# Patient Record
Sex: Female | Born: 1990 | Race: White | Hispanic: No | Marital: Single | Smoking: Never smoker
Health system: Southern US, Community
[De-identification: ages and names within clinical notes are randomized; demographics above are authoritative.]

## PROBLEM LIST (undated history)

## (undated) DIAGNOSIS — F988 Other specified behavioral and emotional disorders with onset usually occurring in childhood and adolescence: Secondary | ICD-10-CM

## (undated) HISTORY — PX: WISDOM TOOTH EXTRACTION: SHX21

## (undated) HISTORY — PX: NO PAST SURGERIES: SHX2092

## (undated) HISTORY — DX: Other specified behavioral and emotional disorders with onset usually occurring in childhood and adolescence: F98.8

---
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
## 2016-01-16 ENCOUNTER — Encounter: Payer: Self-pay | Admitting: Family Medicine

## 2016-01-16 ENCOUNTER — Encounter: Payer: Self-pay | Admitting: *Deleted

## 2016-01-16 ENCOUNTER — Ambulatory Visit (INDEPENDENT_AMBULATORY_CARE_PROVIDER_SITE_OTHER): Payer: BLUE CROSS/BLUE SHIELD | Admitting: Family Medicine

## 2016-01-16 VITALS — BP 115/62 | HR 56 | Temp 98.4°F | Ht 68.0 in | Wt 174.5 lb

## 2016-01-16 DIAGNOSIS — F909 Attention-deficit hyperactivity disorder, unspecified type: Secondary | ICD-10-CM

## 2016-01-16 DIAGNOSIS — F325 Major depressive disorder, single episode, in full remission: Secondary | ICD-10-CM | POA: Insufficient documentation

## 2016-01-16 DIAGNOSIS — F988 Other specified behavioral and emotional disorders with onset usually occurring in childhood and adolescence: Secondary | ICD-10-CM

## 2016-01-16 DIAGNOSIS — G43001 Migraine without aura, not intractable, with status migrainosus: Secondary | ICD-10-CM | POA: Diagnosis not present

## 2016-01-16 NOTE — Progress Notes (Signed)
Pre visit review using our clinic review tool, if applicable. No additional management support is needed unless otherwise documented below in the visit note. 

## 2016-01-16 NOTE — Patient Instructions (Addendum)
Stop at lab on way out for UDS and controlled substance screen.  We will call when prescription ready for pick up.

## 2016-01-16 NOTE — Assessment & Plan Note (Signed)
Stable control on Adderall BID. Will obtain records from previous MD. UDS and controlled sub agreement.  Plan Q3 month follow up.

## 2016-01-16 NOTE — Assessment & Plan Note (Addendum)
Improved control , rare acute case. Treats with OTC meds.

## 2016-01-16 NOTE — Progress Notes (Signed)
   Subjective:    Patient ID: Lisa Ellison, female    DOB: 1991-07-26, 25 y.o.   MRN: [id number]  HPI  25 year old female presents to re-establish care.  She is treated by Dr. Ronita Hipps  in [address] in meantime. She has now moved back to [place].  She has not had a CPX in last few years. She has not seen GYN in years... She plans to re-establish.  Migraine, common: She is only having rare migraine, 2 times a year. Has improved since she has gotten older. Using Excedrin to treat and sleep, water. Last only one day.   ADD, well controlled on  Adderall 20 mg 8 AM, second tab as needed in afternoon for school work.  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 She needs [place] to take over her prescription, last prescription 12/9.   Anxiety and depression, situational during civil lawsuit. About 1 year ago. Did not take med, management with counselor.  Exercsie: Gradually increasing. Diet: Fruits and veggies, water      Review of Systems  Constitutional: Negative for fatigue.  HENT: Negative for ear pain.   Respiratory: Negative for cough and shortness of breath.   Cardiovascular: Negative for chest pain and leg swelling.  Gastrointestinal: Negative for abdominal pain.  Genitourinary: Negative for frequency.  Musculoskeletal: Negative for myalgias.  Neurological: Negative for dizziness.  Psychiatric/Behavioral: Negative for dysphoric mood.       Objective:   Physical Exam  Constitutional: Vital signs are normal. She appears well-developed and well-nourished. She is cooperative.  Non-toxic appearance. She does not appear ill. No distress.  HENT:  Head: Normocephalic.  Right Ear: Hearing, tympanic membrane, external ear and ear canal normal.  Left Ear: Hearing, tympanic membrane, external ear and ear canal normal.  Nose: Nose normal.  Eyes: Conjunctivae, EOM and lids are normal. Pupils are equal, round, and reactive to light. Lids are everted and swept, no foreign  bodies found.  Neck: Trachea normal and normal range of motion. Neck supple. Carotid bruit is not present. No thyroid mass and no thyromegaly present.  Cardiovascular: Normal rate, regular rhythm, S1 normal, S2 normal, normal heart sounds and intact distal pulses.  Exam reveals no gallop.   No murmur heard. Pulmonary/Chest: Effort normal and breath sounds normal. No respiratory distress. She has no wheezes. She has no rhonchi. She has no rales.  Abdominal: Soft. Normal appearance and bowel sounds are normal. She exhibits no distension, no fluid wave, no abdominal bruit and no mass. There is no hepatosplenomegaly. There is no tenderness. There is no rebound, no guarding and no CVA tenderness. No hernia.  Lymphadenopathy:    She has no cervical adenopathy.    She has no axillary adenopathy.  Neurological: She is alert. She has normal strength. No cranial nerve deficit or sensory deficit.  Skin: Skin is warm, dry and intact. No rash noted.  Psychiatric: Her speech is normal and behavior is normal. Judgment normal. Her mood appears not anxious. Cognition and memory are normal. She does not exhibit a depressed mood.          Assessment & Plan:

## 2016-01-22 ENCOUNTER — Telehealth: Payer: Self-pay | Admitting: Family Medicine

## 2016-01-22 NOTE — Telephone Encounter (Signed)
Last office visit 01/16/2016.  Ok to refill?

## 2016-01-22 NOTE — Telephone Encounter (Signed)
Pt called checking on her addreall rx Please advise when ready for pick up

## 2016-01-23 MED ORDER — AMPHETAMINE-DEXTROAMPHETAMINE 20 MG PO TABS: 20.0000 mg | ORAL_TABLET | Freq: Two times a day (BID) | ORAL | Status: DC

## 2016-01-23 NOTE — Telephone Encounter (Signed)
Records were received today.  Placed in Dr. Rometta Emery in box for review.  UDS and contract was done on 01/16/2016.  Results are just not back yet.

## 2016-01-23 NOTE — Telephone Encounter (Signed)
Needs records from previous MD ( already requested, please expedite) and she was supposed to have done UDS and controlled sub agreement on way out.. Did she not?

## 2016-01-23 NOTE — Telephone Encounter (Signed)
Records reviewed. Awaiting drug screen results. When back if neg (out of her med) okay to fill pts med for 1 month.

## 2016-01-23 NOTE — Telephone Encounter (Addendum)
Spoke with [employer].  Advised we just got her medical records today and are still waiting on her UDS results.  Patient states she has been out of her medications since 01/08/2016.  Will refill for one month while waiting for UDS results.  Patient notified prescription will be ready to be picked up after 2pm today.

## 2016-02-02 ENCOUNTER — Encounter: Payer: Self-pay | Admitting: Family Medicine

## 2016-02-22 ENCOUNTER — Other Ambulatory Visit: Payer: Self-pay

## 2016-02-22 MED ORDER — AMPHETAMINE-DEXTROAMPHETAMINE 20 MG PO TABS: 20.0000 mg | ORAL_TABLET | Freq: Two times a day (BID) | ORAL | Status: DC

## 2016-02-22 NOTE — Telephone Encounter (Signed)
Pt left v/m requesting rx for Adderall. Call when ready for pick up. rx last seen 01/16/16 and last printed # 60 on 01/23/16.

## 2016-02-22 NOTE — Telephone Encounter (Signed)
Noya notified prescription is ready to be picked up at the front desk.

## 2016-03-19 ENCOUNTER — Other Ambulatory Visit: Payer: Self-pay

## 2016-03-19 MED ORDER — AMPHETAMINE-DEXTROAMPHETAMINE 20 MG PO TABS: 20.0000 mg | ORAL_TABLET | Freq: Two times a day (BID) | ORAL | Status: DC

## 2016-03-19 NOTE — Telephone Encounter (Signed)
Pt left v/m requesting rx for Adderall. Call when ready for pick up. rx last printed # 60 on 02/22/16; pt last seen 01/16/16.

## 2016-03-20 NOTE — Telephone Encounter (Signed)
Left message for Lisa Ellison that her prescription is ready to be picked up at the front desk. 

## 2016-04-16 ENCOUNTER — Encounter: Payer: Self-pay | Admitting: Family Medicine

## 2016-04-16 ENCOUNTER — Ambulatory Visit (INDEPENDENT_AMBULATORY_CARE_PROVIDER_SITE_OTHER): Payer: BLUE CROSS/BLUE SHIELD | Admitting: Family Medicine

## 2016-04-16 VITALS — BP 102/74 | HR 57 | Temp 97.8°F | Ht 68.0 in | Wt 168.0 lb

## 2016-04-16 DIAGNOSIS — H6983 Other specified disorders of Eustachian tube, bilateral: Secondary | ICD-10-CM

## 2016-04-16 DIAGNOSIS — H6980 Other specified disorders of Eustachian tube, unspecified ear: Secondary | ICD-10-CM | POA: Insufficient documentation

## 2016-04-16 DIAGNOSIS — F909 Attention-deficit hyperactivity disorder, unspecified type: Secondary | ICD-10-CM | POA: Diagnosis not present

## 2016-04-16 DIAGNOSIS — F988 Other specified behavioral and emotional disorders with onset usually occurring in childhood and adolescence: Secondary | ICD-10-CM

## 2016-04-16 DIAGNOSIS — H6990 Unspecified Eustachian tube disorder, unspecified ear: Secondary | ICD-10-CM | POA: Insufficient documentation

## 2016-04-16 MED ORDER — AMPHETAMINE-DEXTROAMPHETAMINE 20 MG PO TABS: 20.0000 mg | ORAL_TABLET | Freq: Two times a day (BID) | ORAL | Status: DC

## 2016-04-16 NOTE — Assessment & Plan Note (Signed)
Flonase prn allergic rhinitis and congestion in ears.

## 2016-04-16 NOTE — Patient Instructions (Signed)
Can use flonase 2 sprays per nostril as needed for allergies and fluid behind ears.

## 2016-04-16 NOTE — Progress Notes (Signed)
ADD Well controlled on Adderall 20 mg 8 AM, second tab as needed in afternoon for school work. 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 Compliant with meds:yes benefit from med (ie increase in concentration):yes change in mood: improved overall from last OV, now with move back to this area. change in appetite: good appetite Insomnia: no Tremor: nothing compliant with behavioral modification: yes  Rare tension headache, no migraines in last year.  She requests hearing to be tested today given intermittant hearing issues, occ congestion from allergies.  PMH and SH reviewed  ROS: See HPI.  Otherwise noncontributory.   Meds, vitals, and allergies reviewed.   GEN: nad, alert and oriented, affect wnl and appropriate HEENT: mucous membranes moist, clear fluid, small amount behind bilateral TMs NECK: supple w/o LA CV: rrr.  PULM: ctab, no inc wob ABD: soft, +bs EXT: no edema CN 2-12 wnl, s/s/dtr wnl x4.  No tremor.

## 2016-04-16 NOTE — Progress Notes (Signed)
Pre visit review using our clinic review tool, if applicable. No additional management support is needed unless otherwise documented below in the visit note. 

## 2016-04-24 ENCOUNTER — Telehealth: Payer: Self-pay

## 2016-04-24 NOTE — Telephone Encounter (Signed)
Pt having vaginal discharge and itching; pt has not take abx. Pt scheduled appt on 04/26/16 at 7:15 with Allie Bossier NP; pt could not come before then due to work schedule; pt wants to know if OK to try antifungal monistat or will that affect the exam on 04/26/16. Pt request cb.

## 2016-04-25 NOTE — Telephone Encounter (Signed)
She may take OTC monistat if she's uncomfortable. If she does have a yeast infection, it will not resolve completely on 1 day's worth of monistat.

## 2016-04-25 NOTE — Telephone Encounter (Signed)
Message left for patient to return my call.  

## 2016-04-26 ENCOUNTER — Ambulatory Visit: Payer: BLUE CROSS/BLUE SHIELD | Admitting: Primary Care

## 2016-04-26 DIAGNOSIS — Z0289 Encounter for other administrative examinations: Secondary | ICD-10-CM

## 2016-04-26 NOTE — Telephone Encounter (Signed)
Patient did not come for their scheduled appointment today for vaginal discharge and itchingr Please let me know if the patient needs to be contacted immediately for follow up or if no follow up is necessary.

## 2016-04-26 NOTE — Telephone Encounter (Signed)
No immediate follow up necessary. 

## 2016-05-14 ENCOUNTER — Other Ambulatory Visit: Payer: Self-pay

## 2016-05-14 MED ORDER — AMPHETAMINE-DEXTROAMPHETAMINE 20 MG PO TABS: 20.0000 mg | ORAL_TABLET | Freq: Two times a day (BID) | ORAL | Status: DC

## 2016-05-14 NOTE — Telephone Encounter (Signed)
Pt left v/m requesting rx for Adderall. Call when ready for pick up. Pt last seen and rx last printed # 60 on 04/16/16.

## 2016-05-15 NOTE — Telephone Encounter (Signed)
Rx placed in Dr. Bedsole's in box for signature. 

## 2016-05-16 NOTE — Telephone Encounter (Addendum)
Left message for Donn that her prescription is ready to be picked up at the front desk. 

## 2016-06-19 ENCOUNTER — Other Ambulatory Visit: Payer: Self-pay

## 2016-06-19 MED ORDER — AMPHETAMINE-DEXTROAMPHETAMINE 20 MG PO TABS: 20.0000 mg | ORAL_TABLET | Freq: Two times a day (BID) | ORAL | Status: DC

## 2016-06-19 NOTE — Telephone Encounter (Signed)
Please print, I will sign in am

## 2016-06-19 NOTE — Telephone Encounter (Signed)
Pt left v/m requesting rx for Adderall. Call when ready for pick up. rx last printed # 60 on 05/14/16. Pt last seen 04/16/16.

## 2016-06-20 NOTE — Telephone Encounter (Signed)
Left message for Lisa Ellison that her prescription is ready to be picked up at the front desk. 

## 2016-07-16 ENCOUNTER — Other Ambulatory Visit: Payer: Self-pay

## 2016-07-16 NOTE — Telephone Encounter (Signed)
Pt left v/m requesting rx for Adderall. Call when ready for pick up. rx last printed # 60 on 06/19/16. Last seen 04/16/16. Dr Diona Browner out of office.

## 2016-07-17 MED ORDER — AMPHETAMINE-DEXTROAMPHETAMINE 20 MG PO TABS: 20.0000 mg | ORAL_TABLET | Freq: Two times a day (BID) | ORAL | Status: DC

## 2016-07-17 NOTE — Telephone Encounter (Addendum)
Left message for Lisa Ellison that her prescription is ready to be picked up at the front desk. 

## 2016-07-19 DIAGNOSIS — Z113 Encounter for screening for infections with a predominantly sexual mode of transmission: Secondary | ICD-10-CM | POA: Diagnosis not present

## 2016-07-19 DIAGNOSIS — Z3202 Encounter for pregnancy test, result negative: Secondary | ICD-10-CM | POA: Diagnosis not present

## 2016-07-19 DIAGNOSIS — Z114 Encounter for screening for human immunodeficiency virus [HIV]: Secondary | ICD-10-CM | POA: Diagnosis not present

## 2016-07-19 DIAGNOSIS — Z30011 Encounter for initial prescription of contraceptive pills: Secondary | ICD-10-CM | POA: Diagnosis not present

## 2016-08-13 ENCOUNTER — Telehealth: Payer: Self-pay | Admitting: *Deleted

## 2016-08-13 ENCOUNTER — Other Ambulatory Visit: Payer: Self-pay

## 2016-08-13 MED ORDER — AMPHETAMINE-DEXTROAMPHETAMINE 20 MG PO TABS: 20.0000 mg | ORAL_TABLET | Freq: Two times a day (BID) | ORAL | 0 refills | Status: DC

## 2016-08-13 NOTE — Telephone Encounter (Signed)
Left VM on cell that RX is available for pick up @ front office

## 2016-08-13 NOTE — Telephone Encounter (Signed)
Pt left v/m requesting rx for Adderall. Call when ready for pick up. rx last printed # 60 on 07/17/16. Last seen 04/16/16.

## 2016-08-13 NOTE — Telephone Encounter (Signed)
Left detailed message on voicemail. Rx left at front desk for pick up.  

## 2016-08-27 DIAGNOSIS — Z6825 Body mass index (BMI) 25.0-25.9, adult: Secondary | ICD-10-CM | POA: Diagnosis not present

## 2016-08-27 DIAGNOSIS — Z1231 Encounter for screening mammogram for malignant neoplasm of breast: Secondary | ICD-10-CM | POA: Diagnosis not present

## 2016-08-27 DIAGNOSIS — Z01419 Encounter for gynecological examination (general) (routine) without abnormal findings: Secondary | ICD-10-CM | POA: Diagnosis not present

## 2016-08-27 DIAGNOSIS — E041 Nontoxic single thyroid nodule: Secondary | ICD-10-CM | POA: Diagnosis not present

## 2016-08-27 LAB — HM PAP SMEAR: HM Pap smear: NORMAL

## 2016-08-28 ENCOUNTER — Other Ambulatory Visit: Payer: Self-pay | Admitting: Obstetrics and Gynecology

## 2016-08-28 DIAGNOSIS — E041 Nontoxic single thyroid nodule: Secondary | ICD-10-CM

## 2016-08-29 ENCOUNTER — Ambulatory Visit
Admission: RE | Admit: 2016-08-29 | Discharge: 2016-08-29 | Disposition: A | Discharge status: Home / Self Care | Payer: BLUE CROSS/BLUE SHIELD | Source: Ambulatory Visit | Attending: Obstetrics and Gynecology | Admitting: Obstetrics and Gynecology

## 2016-08-29 DIAGNOSIS — E041 Nontoxic single thyroid nodule: Secondary | ICD-10-CM

## 2016-09-09 ENCOUNTER — Other Ambulatory Visit: Payer: Self-pay

## 2016-09-09 MED ORDER — AMPHETAMINE-DEXTROAMPHETAMINE 20 MG PO TABS: 20.0000 mg | ORAL_TABLET | Freq: Two times a day (BID) | ORAL | 0 refills | Status: DC

## 2016-09-09 NOTE — Telephone Encounter (Signed)
Pt left v/m requesting rx for Adderall. Call when ready for pick up. rx last printed # 60 on 08/13/16. Last seen 04/16/16.

## 2016-09-10 NOTE — Telephone Encounter (Signed)
Left message for Kielee that her prescription is ready to be picked up at the front desk. 

## 2016-10-07 ENCOUNTER — Telehealth: Payer: Self-pay | Admitting: Family Medicine

## 2016-10-07 MED ORDER — AMPHETAMINE-DEXTROAMPHETAMINE 20 MG PO TABS: 20.0000 mg | ORAL_TABLET | Freq: Two times a day (BID) | ORAL | 0 refills | Status: DC

## 2016-10-07 NOTE — Telephone Encounter (Signed)
Pt called to request a refill of her Adderall Be [facility] to [facility] [place].

## 2016-10-07 NOTE — Telephone Encounter (Signed)
Last office visit 04/16/2016.  Office visit scheduled for 10/17/16 at 9:45 am with Dr. Diona Browner.  Last refilled 09/09/2016.  Rx printed and placed in Dr. Rometta Emery in box for signature.

## 2016-10-08 NOTE — Telephone Encounter (Signed)
Please call patient when rx is ready for pick up at [phone].

## 2016-10-08 NOTE — Telephone Encounter (Signed)
Lisa Ellison notified prescription is ready to be picked up at the front desk.

## 2016-10-17 ENCOUNTER — Ambulatory Visit (INDEPENDENT_AMBULATORY_CARE_PROVIDER_SITE_OTHER): Payer: BLUE CROSS/BLUE SHIELD | Admitting: Family Medicine

## 2016-10-17 ENCOUNTER — Encounter: Payer: Self-pay | Admitting: Family Medicine

## 2016-10-17 DIAGNOSIS — F988 Other specified behavioral and emotional disorders with onset usually occurring in childhood and adolescence: Secondary | ICD-10-CM | POA: Diagnosis not present

## 2016-10-17 DIAGNOSIS — E042 Nontoxic multinodular goiter: Secondary | ICD-10-CM | POA: Insufficient documentation

## 2016-10-17 NOTE — Progress Notes (Signed)
ADD, well controleld per pt. Compliant with meds: Adderall 20 mg 8 AM, second tab as needed in afternoon for school work. She almost done..taking class in spring. benefit from med (ie increase in concentration): change in mood: improved since moving to [place] change in appetite:  Good Wt Readings from Last 3 Encounters:  10/17/16 167 lb 12 oz (76.1 kg)  04/16/16 168 lb (76.2 kg)  01/16/16 174 lb 8 oz (79.2 kg)  Insomnia: nml Tremor: none compliant with behavioral modification: yes   GYN noted nodule in thyroid.Marland Kitchen sent for [place]: multiple nodules seen. Has appt with ENDO  Nov 20 Dr. Chalmers Cater.  PMH and SH reviewed  ROS: Per HPI unless specifically indicated in ROS section   Meds, vitals, and allergies reviewed.   GEN: nad, alert and oriented, affect wnl and appropriate HEENT: mucous membranes moist NECK: supple w/o LA, small nodule palpated on right or thyroid, no thyroid enlargement noted. CV: rrr.  PULM: ctab, no inc wob ABD: soft, +bs EXT: no edema CN 2-12 wnl, s/s/dtr wnl x4.  No tremor.

## 2016-10-17 NOTE — Assessment & Plan Note (Signed)
Has appt with endo in Nov. Answered pts questions ad concerns about abn tyroid [place].

## 2016-10-17 NOTE — Assessment & Plan Note (Signed)
Stable control on adderall 1-2 times daily.

## 2016-10-17 NOTE — Progress Notes (Signed)
Pre visit review using our clinic review tool, if applicable. No additional management support is needed unless otherwise documented below in the visit note. 

## 2016-10-22 ENCOUNTER — Encounter: Payer: Self-pay | Admitting: Family Medicine

## 2016-11-04 ENCOUNTER — Other Ambulatory Visit: Payer: Self-pay

## 2016-11-04 NOTE — Telephone Encounter (Signed)
Pt left v/m requesting rx for Adderall. Call when ready for pick up. Last printed # 60 on 10/07/16; last seen 10/17/16. Please advise.

## 2016-11-05 MED ORDER — AMPHETAMINE-DEXTROAMPHETAMINE 20 MG PO TABS: 20.0000 mg | ORAL_TABLET | Freq: Two times a day (BID) | ORAL | 0 refills | Status: DC

## 2016-11-05 NOTE — Telephone Encounter (Signed)
Left message for Lisa Ellison that her prescription is ready to be picked up at the front desk. 

## 2016-11-18 DIAGNOSIS — E041 Nontoxic single thyroid nodule: Secondary | ICD-10-CM | POA: Diagnosis not present

## 2016-12-02 NOTE — Telephone Encounter (Signed)
Pt left v/m requesting rx for Adderall. Call when ready for pick up. Last printed # 60 on 10/716; last seen 10/17/16. Please advise.

## 2016-12-02 NOTE — Addendum Note (Signed)
Addended by: Carter Kitten on: 12/02/2016 10:35 AM   Modules accepted: Orders

## 2016-12-03 ENCOUNTER — Other Ambulatory Visit: Payer: Self-pay

## 2016-12-03 MED ORDER — AMPHETAMINE-DEXTROAMPHETAMINE 20 MG PO TABS: 20.0000 mg | ORAL_TABLET | Freq: Two times a day (BID) | ORAL | 0 refills | Status: DC

## 2016-12-03 NOTE — Telephone Encounter (Signed)
rx sign in donna's box

## 2016-12-03 NOTE — Telephone Encounter (Signed)
Pt walked in requesting refill of adderall requested on 12/02/16; I did not see where requested on 12/02/16; last seen 10/17/16;last printed # 60 on 11/05/16. Pt starts finals on 12/04/16. Pt request to pick up rx on 12/04/16 in AM.

## 2016-12-04 NOTE — Telephone Encounter (Signed)
Left message for Karcyn that her prescription is ready to be picked up at the front desk. 

## 2016-12-04 NOTE — Telephone Encounter (Signed)
Left message for Lisa Ellison that her prescription is ready to be picked up at the front desk. 

## 2016-12-12 DIAGNOSIS — L814 Other melanin hyperpigmentation: Secondary | ICD-10-CM | POA: Diagnosis not present

## 2016-12-31 ENCOUNTER — Telehealth: Payer: Self-pay | Admitting: Family Medicine

## 2016-12-31 MED ORDER — AMPHETAMINE-DEXTROAMPHETAMINE 20 MG PO TABS: 20.0000 mg | ORAL_TABLET | Freq: Two times a day (BID) | ORAL | 0 refills | Status: DC

## 2016-12-31 NOTE — Telephone Encounter (Signed)
Left message for Lisa Ellison that her prescription is ready to be picked up at the front desk. 

## 2016-12-31 NOTE — Telephone Encounter (Signed)
Last office visit 10/17/2016.  Last refilled 12/03/2016 for #60 with no refills.  Rx printed and placed in Dr. Rometta Emery in box for signature.

## 2016-12-31 NOTE — Telephone Encounter (Signed)
Pt is calling to request a refill of her adderoll.  Can you please call it in if appropriate.  [facility].

## 2017-01-28 ENCOUNTER — Encounter: Payer: Self-pay | Admitting: Family Medicine

## 2017-01-28 ENCOUNTER — Other Ambulatory Visit: Payer: Self-pay

## 2017-01-28 MED ORDER — AMPHETAMINE-DEXTROAMPHETAMINE 20 MG PO TABS: 20.0000 mg | ORAL_TABLET | Freq: Two times a day (BID) | ORAL | 0 refills | Status: DC

## 2017-01-28 NOTE — Telephone Encounter (Signed)
Pt left v/m requesting rx for Adderall. Call when ready for pick up. Last printed # 60 on 12/31/16. Last seen 10/17/16.

## 2017-01-28 NOTE — Telephone Encounter (Signed)
Left message for Lisa Ellison that her prescription is ready to be picked up at the front desk. 

## 2017-01-29 ENCOUNTER — Encounter: Payer: Self-pay | Admitting: Family Medicine

## 2017-01-29 DIAGNOSIS — Z79899 Other long term (current) drug therapy: Secondary | ICD-10-CM | POA: Diagnosis not present

## 2017-02-25 ENCOUNTER — Other Ambulatory Visit: Payer: Self-pay

## 2017-02-25 MED ORDER — AMPHETAMINE-DEXTROAMPHETAMINE 20 MG PO TABS: 20.0000 mg | ORAL_TABLET | Freq: Two times a day (BID) | ORAL | 0 refills | Status: DC

## 2017-02-25 NOTE — Telephone Encounter (Signed)
Pt left v/m requesting rx for Adderall. Call when ready for pick up. rx last printed # 71 on 01/28/17; last seen 10/17/16.

## 2017-02-26 NOTE — Telephone Encounter (Signed)
Left message for Lisa Ellison, that her prescription is ready to be picked up at the front desk.

## 2017-03-24 ENCOUNTER — Other Ambulatory Visit: Payer: Self-pay

## 2017-03-24 MED ORDER — AMPHETAMINE-DEXTROAMPHETAMINE 20 MG PO TABS: 20.0000 mg | ORAL_TABLET | Freq: Two times a day (BID) | ORAL | 0 refills | Status: DC

## 2017-03-24 NOTE — Telephone Encounter (Addendum)
Pt left v/m requesting rx for Adderall. Call when ready for pick up. Last printed # 60 on 02/25/17. Last seen 10/17/17. Dr Diona Browner out of office this week.

## 2017-03-24 NOTE — Telephone Encounter (Signed)
Left message for Lisa Ellison that her prescription is ready to be picked up at the front desk.

## 2017-04-01 DIAGNOSIS — H04123 Dry eye syndrome of bilateral lacrimal glands: Secondary | ICD-10-CM | POA: Diagnosis not present

## 2017-04-16 ENCOUNTER — Other Ambulatory Visit: Payer: Self-pay | Admitting: Endocrinology

## 2017-04-16 DIAGNOSIS — E041 Nontoxic single thyroid nodule: Secondary | ICD-10-CM

## 2017-04-18 ENCOUNTER — Ambulatory Visit (INDEPENDENT_AMBULATORY_CARE_PROVIDER_SITE_OTHER): Payer: BLUE CROSS/BLUE SHIELD | Admitting: Family Medicine

## 2017-04-18 ENCOUNTER — Encounter: Payer: Self-pay | Admitting: Family Medicine

## 2017-04-18 VITALS — BP 110/62 | HR 87 | Temp 98.5°F | Ht 68.0 in | Wt 172.0 lb

## 2017-04-18 DIAGNOSIS — F988 Other specified behavioral and emotional disorders with onset usually occurring in childhood and adolescence: Secondary | ICD-10-CM

## 2017-04-18 MED ORDER — AMPHETAMINE-DEXTROAMPHETAMINE 20 MG PO TABS: 20.0000 mg | ORAL_TABLET | Freq: Two times a day (BID) | ORAL | 0 refills | Status: DC

## 2017-04-18 NOTE — Addendum Note (Signed)
Addended by: Carter Kitten on: 04/18/2017 10:59 AM   Modules accepted: Orders

## 2017-04-18 NOTE — Progress Notes (Signed)
ADD  6 month follow I[p Compliant with meds:Adderall 20 mg 8 AM, second tab as needed in afternoon for school work. benefit from med (ie increase in concentration): yes change in mood:none change in appetite: none Insomnia:none tremor:none compliant with behavioral modification: none   Has GYN  Dr. Radene Knee exam done  2017 , nml pap.   Wt Readings from Last 3 Encounters:  04/18/17 172 lb (78 kg)  10/17/16 167 lb 12 oz (76.1 kg)  04/16/16 168 lb (76.2 kg)  Body mass index is 26.15 kg/m.  PMH and SH reviewed Working at [employer] in 1 year at [place]: Per HPI unless specifically indicated in ROS section   Meds, vitals, and allergies reviewed.   GEN: nad, alert and oriented, affect wnl and appropriate HEENT: mucous membranes moist NECK: supple w/o LA CV: rrr.  PULM: ctab, no inc wob ABD: soft, +bs EXT: no edema CN 2-12 wnl, s/s/dtr wnl x4.  No tremor.

## 2017-04-18 NOTE — Assessment & Plan Note (Signed)
.  Good control on current regimen, no SE or issues with regimen.

## 2017-04-18 NOTE — Progress Notes (Signed)
Pre visit review using our clinic review tool, if applicable. No additional management support is needed unless otherwise documented below in the visit note. 

## 2017-05-12 ENCOUNTER — Ambulatory Visit
Admission: RE | Admit: 2017-05-12 | Discharge: 2017-05-12 | Disposition: A | Discharge status: Home / Self Care | Payer: BLUE CROSS/BLUE SHIELD | Source: Ambulatory Visit | Attending: Endocrinology | Admitting: Endocrinology

## 2017-05-12 DIAGNOSIS — E041 Nontoxic single thyroid nodule: Secondary | ICD-10-CM | POA: Diagnosis not present

## 2017-05-12 DIAGNOSIS — E042 Nontoxic multinodular goiter: Secondary | ICD-10-CM | POA: Diagnosis not present

## 2017-05-19 DIAGNOSIS — E041 Nontoxic single thyroid nodule: Secondary | ICD-10-CM | POA: Diagnosis not present

## 2017-09-10 DIAGNOSIS — N760 Acute vaginitis: Secondary | ICD-10-CM | POA: Diagnosis not present

## 2017-09-10 DIAGNOSIS — Z6825 Body mass index (BMI) 25.0-25.9, adult: Secondary | ICD-10-CM | POA: Diagnosis not present

## 2017-09-10 DIAGNOSIS — Z131 Encounter for screening for diabetes mellitus: Secondary | ICD-10-CM | POA: Diagnosis not present

## 2017-09-10 DIAGNOSIS — Z01419 Encounter for gynecological examination (general) (routine) without abnormal findings: Secondary | ICD-10-CM | POA: Diagnosis not present

## 2017-09-10 DIAGNOSIS — Z13220 Encounter for screening for lipoid disorders: Secondary | ICD-10-CM | POA: Diagnosis not present

## 2017-10-16 ENCOUNTER — Ambulatory Visit (INDEPENDENT_AMBULATORY_CARE_PROVIDER_SITE_OTHER): Payer: BLUE CROSS/BLUE SHIELD | Admitting: Family Medicine

## 2017-10-16 ENCOUNTER — Encounter: Payer: Self-pay | Admitting: Family Medicine

## 2017-10-16 DIAGNOSIS — F988 Other specified behavioral and emotional disorders with onset usually occurring in childhood and adolescence: Secondary | ICD-10-CM

## 2017-10-16 MED ORDER — AMPHETAMINE-DEXTROAMPHETAMINE 20 MG PO TABS: 20.0000 mg | ORAL_TABLET | Freq: Two times a day (BID) | ORAL | 0 refills | Status: DC

## 2017-10-16 NOTE — Assessment & Plan Note (Signed)
Stable control without complications or SE to medication.  No mood disorders.   Refilled medication ( adderall L 20 mg daily) for 6 months, follow up in 6 months.

## 2017-10-16 NOTE — Progress Notes (Signed)
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 On Adderall 20 mg daily. Compliant with meds: yes benefit from med (ie increase in concentration): yes change in mood: denies depression, PHQ2:0 change in appetite: none Insomnia: none tremor:none compliant with behavioral modification: yes.  PMH and SH reviewed  ROS: Per HPI unless specifically indicated in ROS section   Meds, vitals, and allergies reviewed.   GEN: nad, alert and oriented, affect wnl and appropriate HEENT: mucous membranes moist NECK: supple w/o LA CV: rrr.  PULM: ctab, no inc wob ABD: soft, +bs EXT: no edema CN 2-12 wnl, s/s/dtr wnl x4.  No tremor.

## 2017-10-24 ENCOUNTER — Ambulatory Visit: Payer: BLUE CROSS/BLUE SHIELD | Admitting: Family Medicine

## 2018-01-04 IMAGING — US US THYROID
2 series · 13 of 25 positions shown · non-contrast
Comparison: 08/29/2016

CLINICAL DATA: Prior ultrasound follow-up.  Follow-up nodules.

EXAM:
THYROID ULTRASOUND
TECHNIQUE: Ultrasound examination of the thyroid gland and adjacent soft
tissues was performed.

[Series 1: us thyroid · 0.05mm/px · 12 of 50 slices shown (1 of 2)]
[im 1/50]
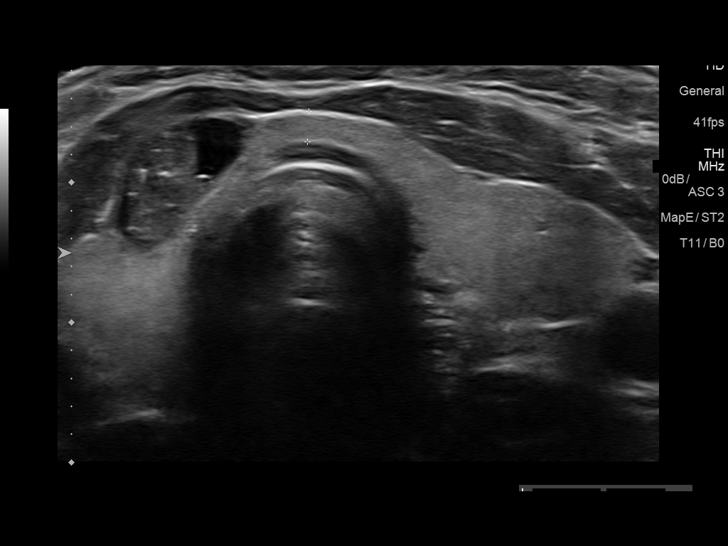
[im 5/50]
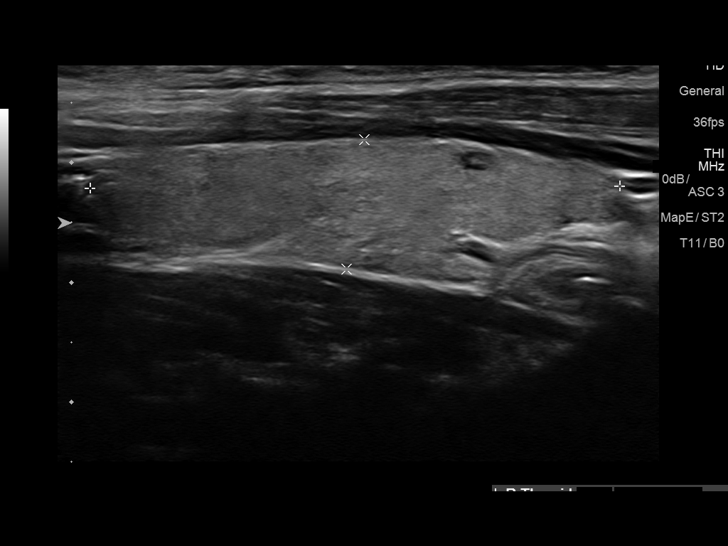
[im 9/50]
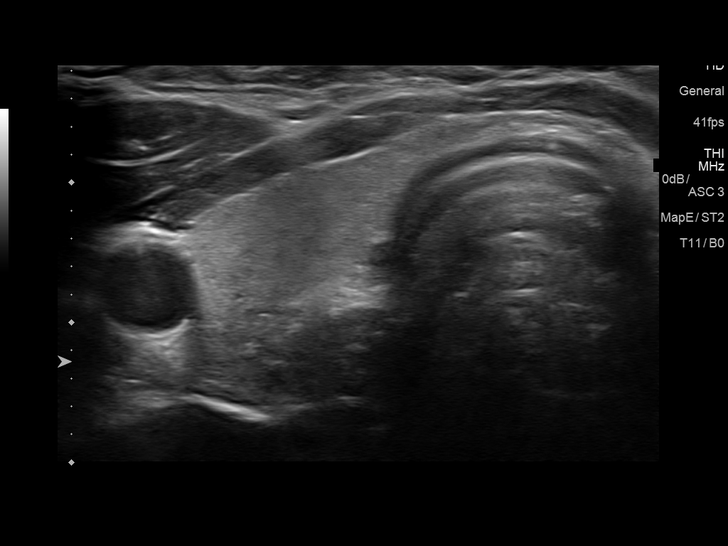
[im 13/50]
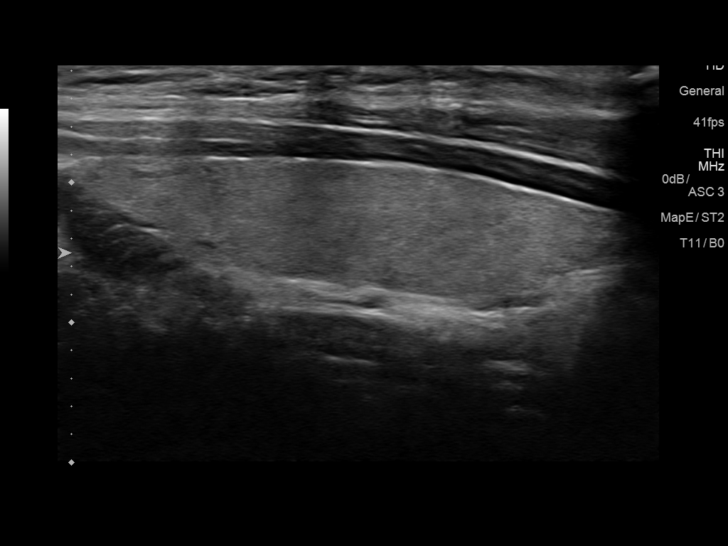
[im 18/50]
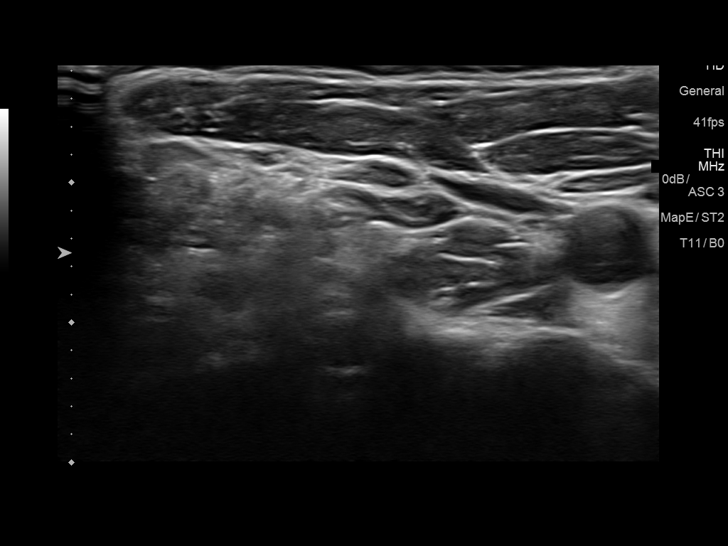
[im 22/50]
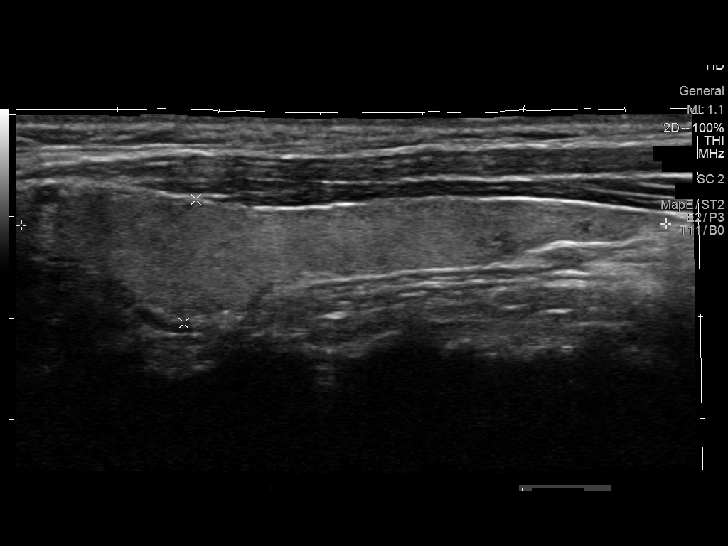
[im 26/50]
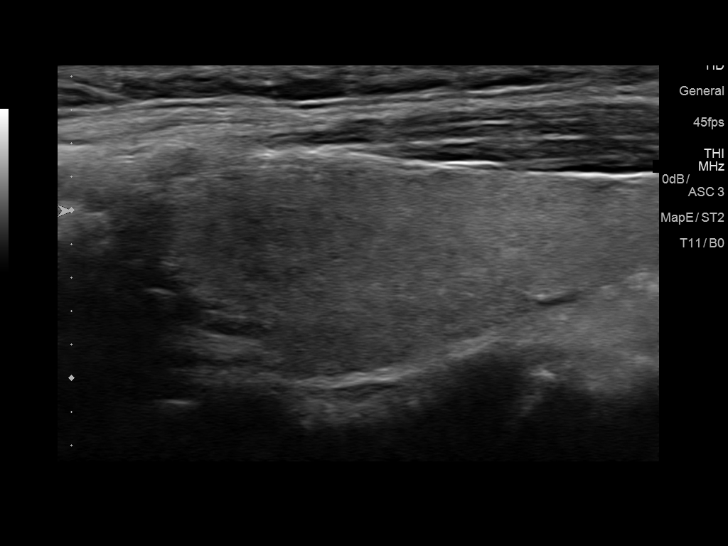
[im 30/50]
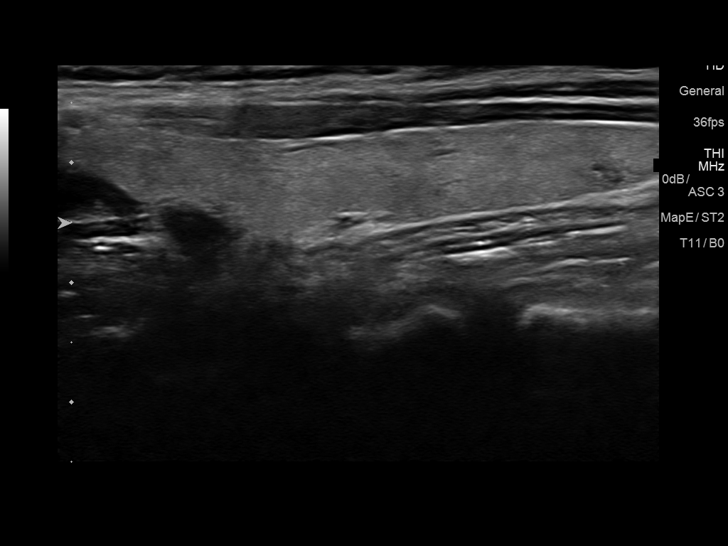
[im 35/50]
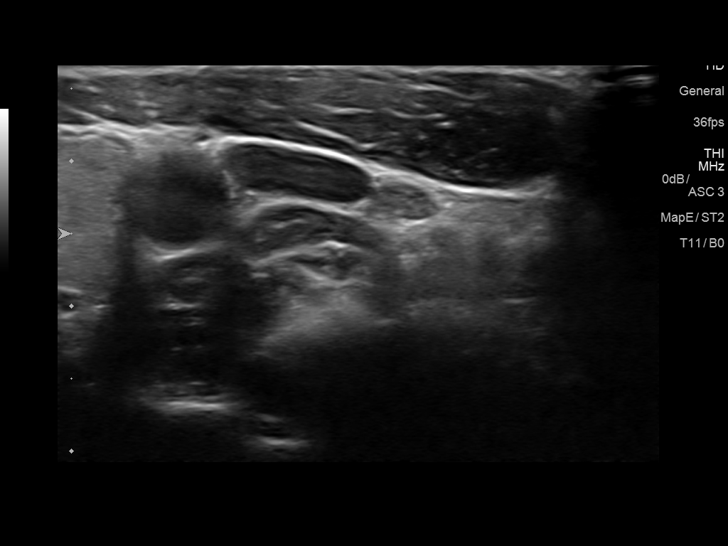
[im 39/50]
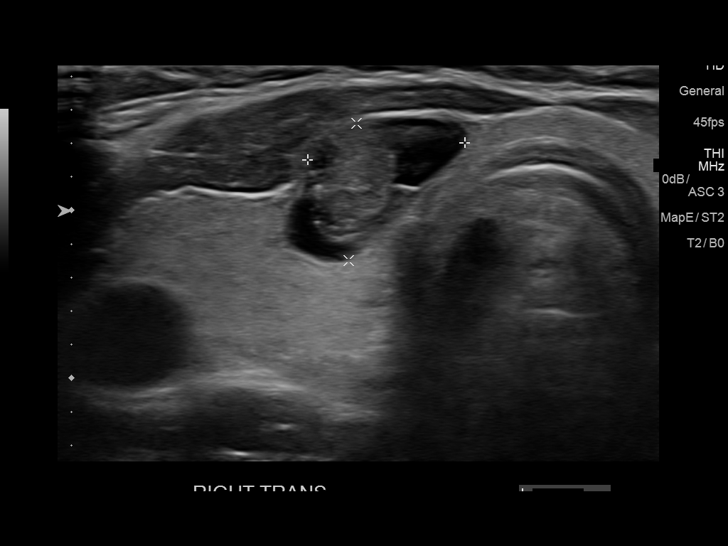
[im 43/50]
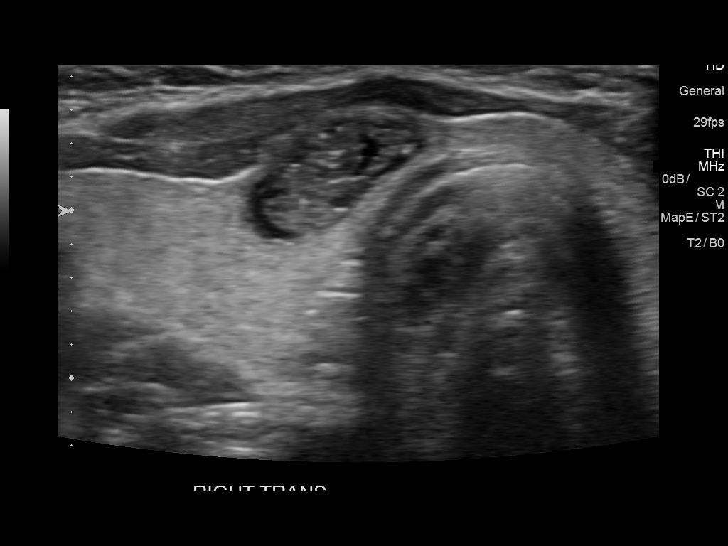
[im 47/50]
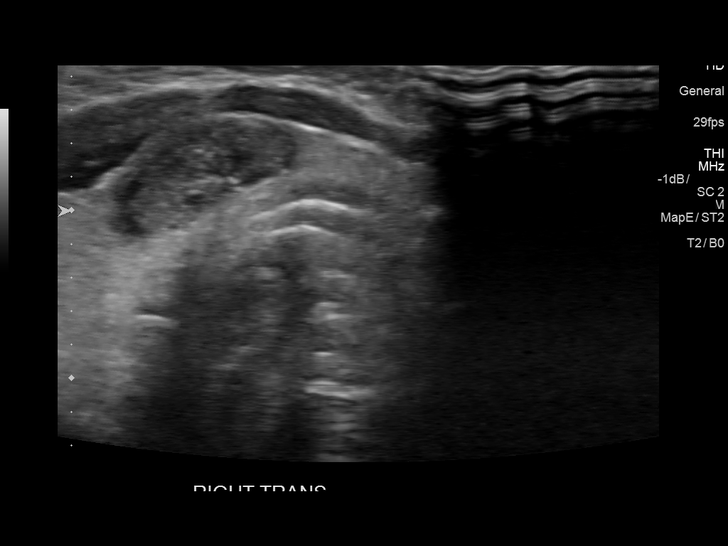

[Series 2001: us thyroid · 0.04mm/px · 1 of 1 slices shown (2 of 2)]
[im 1/1]
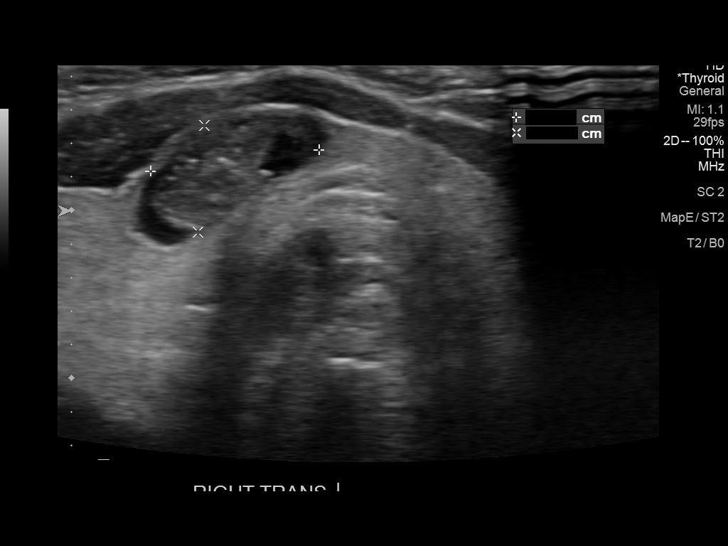

[13 of 25 positions shown; findings below may reference images not displayed]

FINDINGS: Parenchymal Echotexture: Mildly heterogenous

Isthmus: 0.2 cm, previously 0.2 cm

Right lobe: 4.4 x 1.1 x 1.4 cm, previously 4.9 x 1.0 x 1.6 cm

Left lobe: 6.3 x 1.2 x 1.8 cm, previously 6.1 x 0.9 x 1.7 cm

_________________________________________________________

Estimated total number of nodules >/= 1 cm: 1

Number of spongiform nodules >/=  2 cm not described below (TR1): 0

Number of mixed cystic and solid nodules >/= 1.5 cm not described
below (TR2): 0

_________________________________________________________

Nodule # 1:

Prior biopsy: No

Location: Isthmus; Mid

Maximum size: 1.3 cm; Other 2 dimensions: 1.1 x 0.9 cm, previously,
1.3 cm

Composition: solid/almost completely solid (2)

Echogenicity: hypoechoic (2)

Shape: not taller-than-wide (0)

Margins: smooth (0)

Echogenic foci: none (0)

ACR TI-RADS total points: 4.

ACR TI-RADS risk category:  TR4 (4-6 points).

Significant change in size (>/= 20% in two dimensions and minimal
increase of 2 mm): No

Change in features: No

Change in ACR TI-RADS risk category: No

ACR TI-RADS recommendations:

*Given size (>/= 1 - 1.4 cm) and appearance, a follow-up ultrasound
in 1 year should be considered based on TI-RADS criteria.

_________________________________________________________
IMPRESSION: The nodule in the isthmus is not significantly changed. Annual
follow-up is recommended.

The above is in keeping with the ACR TI-RADS recommendations - [HOSPITAL] 6272;[DATE].

## 2018-04-03 DIAGNOSIS — H1045 Other chronic allergic conjunctivitis: Secondary | ICD-10-CM | POA: Diagnosis not present

## 2018-04-10 ENCOUNTER — Encounter: Payer: Self-pay | Admitting: Family Medicine

## 2018-04-10 ENCOUNTER — Other Ambulatory Visit: Payer: Self-pay

## 2018-04-10 ENCOUNTER — Ambulatory Visit: Payer: BLUE CROSS/BLUE SHIELD | Admitting: Family Medicine

## 2018-04-10 VITALS — BP 120/86 | HR 76 | Temp 98.3°F | Ht 68.0 in | Wt 170.5 lb

## 2018-04-10 DIAGNOSIS — E042 Nontoxic multinodular goiter: Secondary | ICD-10-CM

## 2018-04-10 DIAGNOSIS — F325 Major depressive disorder, single episode, in full remission: Secondary | ICD-10-CM

## 2018-04-10 DIAGNOSIS — F988 Other specified behavioral and emotional disorders with onset usually occurring in childhood and adolescence: Secondary | ICD-10-CM

## 2018-04-10 MED ORDER — AMPHETAMINE-DEXTROAMPHETAMINE 20 MG PO TABS: 20.0000 mg | ORAL_TABLET | Freq: Two times a day (BID) | ORAL | 0 refills | Status: DC

## 2018-04-10 NOTE — Assessment & Plan Note (Signed)
Resolved on no medication. 

## 2018-04-10 NOTE — Progress Notes (Signed)
ADD follow up,  6 months Compliant with meds: Adderall 20 mg daily benefit from med (ie increase in concentration): yes change in mood:none change in appetite: none Insomnia:none Tremor: none compliant with behavioral modification: yes   Wt Readings from Last 3 Encounters:  04/10/18 170 lb 8 oz (77.3 kg)  10/16/17 175 lb 8 oz (79.6 kg)  04/18/17 172 lb (78 kg)   Body mass index is 25.92 kg/m.   PMH and SH reviewed  BP Readings from Last 3 Encounters:  04/10/18 120/86  10/16/17 102/70  04/18/17 110/62    ROS: Per HPI unless specifically indicated in ROS section   Meds, vitals, and allergies reviewed.   GEN: nad, alert and oriented, affect wnl and appropriate HEENT: mucous membranes moist NECK: supple w/o LA CV: rrr.  PULM: ctab, no inc wob ABD: soft, +bs EXT: no edema CN 2-12 wnl, s/s/dtr wnl x4.  No tremor.

## 2018-04-10 NOTE — Patient Instructions (Signed)
No changes made! Keep up healthy lifestyle.

## 2018-04-15 DIAGNOSIS — D225 Melanocytic nevi of trunk: Secondary | ICD-10-CM | POA: Diagnosis not present

## 2018-04-15 DIAGNOSIS — Q825 Congenital non-neoplastic nevus: Secondary | ICD-10-CM | POA: Diagnosis not present

## 2018-04-15 DIAGNOSIS — L814 Other melanin hyperpigmentation: Secondary | ICD-10-CM | POA: Diagnosis not present

## 2018-04-15 DIAGNOSIS — D2372 Other benign neoplasm of skin of left lower limb, including hip: Secondary | ICD-10-CM | POA: Diagnosis not present

## 2018-04-17 ENCOUNTER — Ambulatory Visit: Payer: BLUE CROSS/BLUE SHIELD | Admitting: Family Medicine

## 2018-04-23 ENCOUNTER — Other Ambulatory Visit: Payer: Self-pay | Admitting: Endocrinology

## 2018-04-23 DIAGNOSIS — E041 Nontoxic single thyroid nodule: Secondary | ICD-10-CM

## 2018-05-06 ENCOUNTER — Ambulatory Visit
Admission: RE | Admit: 2018-05-06 | Discharge: 2018-05-06 | Disposition: A | Discharge status: Home / Self Care | Payer: BLUE CROSS/BLUE SHIELD | Source: Ambulatory Visit | Attending: Endocrinology | Admitting: Endocrinology

## 2018-05-06 DIAGNOSIS — E041 Nontoxic single thyroid nodule: Secondary | ICD-10-CM | POA: Diagnosis not present

## 2018-05-12 ENCOUNTER — Other Ambulatory Visit: Payer: Self-pay | Admitting: Family Medicine

## 2018-05-12 NOTE — Telephone Encounter (Signed)
I spoke with Lisa Ellison at [facility] and available rx for adderall are on hold at pharmacy and Lisa Ellison will get adderall ready for pick up. Pt notified and voiced understanding.

## 2018-05-19 DIAGNOSIS — E041 Nontoxic single thyroid nodule: Secondary | ICD-10-CM | POA: Diagnosis not present

## 2018-07-01 ENCOUNTER — Other Ambulatory Visit: Payer: Self-pay | Admitting: Family Medicine

## 2018-07-01 MED ORDER — AMPHETAMINE-DEXTROAMPHETAMINE 20 MG PO TABS: 20.0000 mg | ORAL_TABLET | Freq: Two times a day (BID) | ORAL | 0 refills | Status: DC

## 2018-07-01 NOTE — Telephone Encounter (Signed)
Last office visit 04/10/2018.

## 2018-07-01 NOTE — Telephone Encounter (Signed)
Copied from [place] [phone]. Topic: Quick Communication - See Telephone Encounter >> Jul 01, 2018  4:41 PM Percell Belt A wrote: CRM for notification. See Telephone encounter for: 07/01/18.  Pt called in and stated that pharamcy told her that they can only accept 3 month of the amphetamine-dextroamphetamine (ADDERALL) 20 MG tablet [[id number] at time.  She is  needing July Aug, Sept sent over to pharmacy   [facility] on [address]

## 2018-09-22 IMAGING — US US THYROID
1 series · 13 of 25 positions shown · non-contrast
Comparison: Prior thyroid ultrasound 05/12/2017; 08/29/2016

CLINICAL DATA: Prior ultrasound follow-up. 27-year-old female with
an isthmic thyroid nodule.

EXAM:
THYROID ULTRASOUND
TECHNIQUE: Ultrasound examination of the thyroid gland and adjacent soft
tissues was performed.

[Series 1: us thyroid · 0.04mm/px · 13 of 44 slices shown]
[im 1/44]
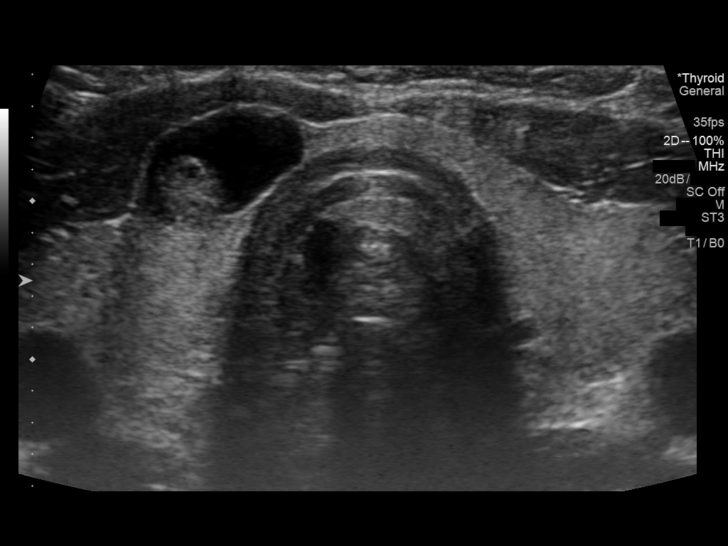
[im 4/44]
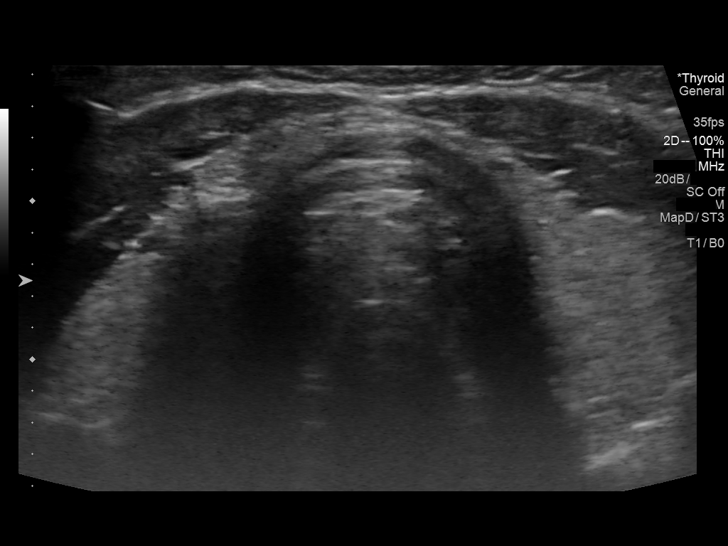
[im 8/44]
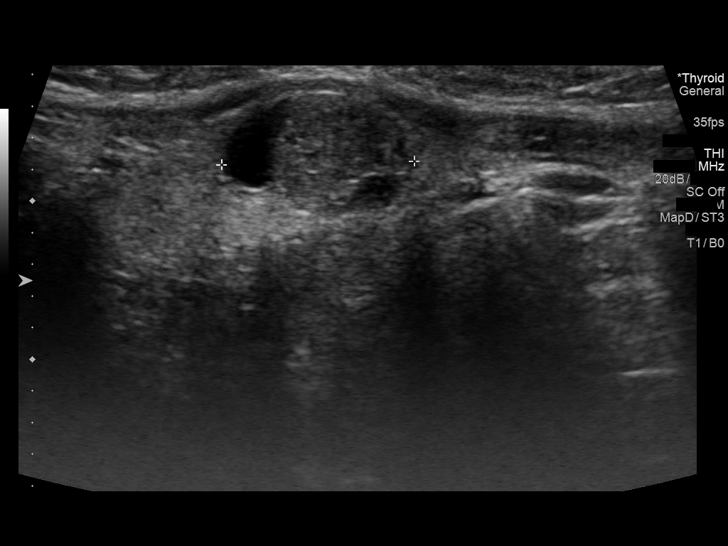
[im 11/44]
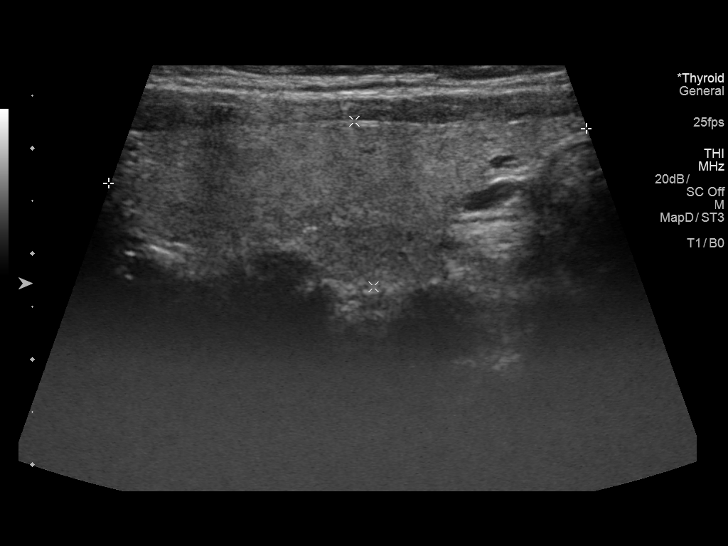
[im 15/44]
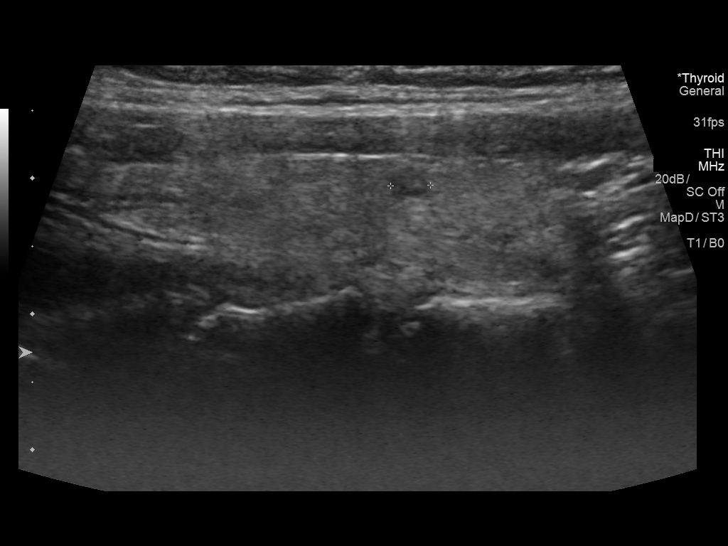
[im 18/44]
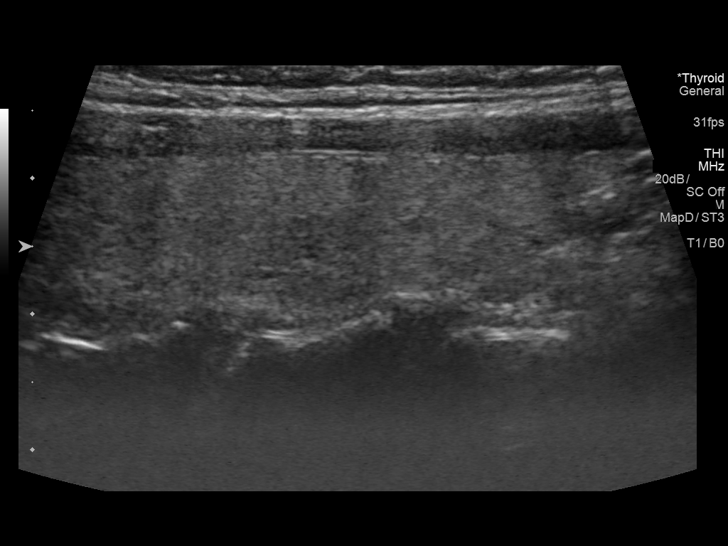
[im 22/44]
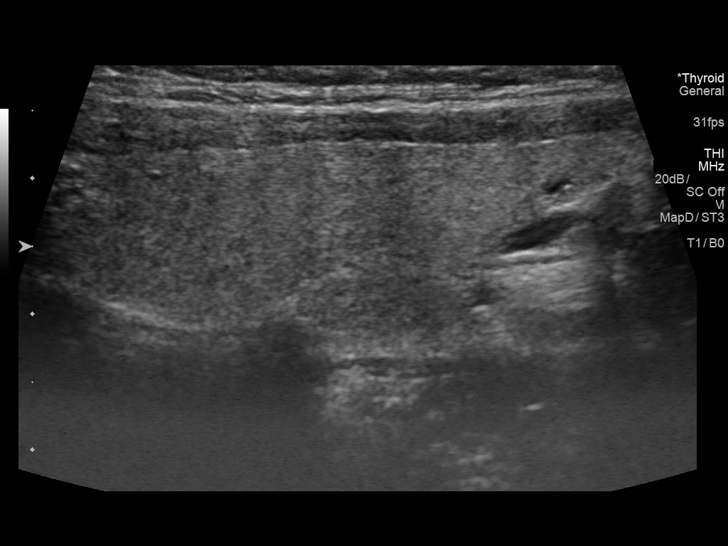
[im 26/44]
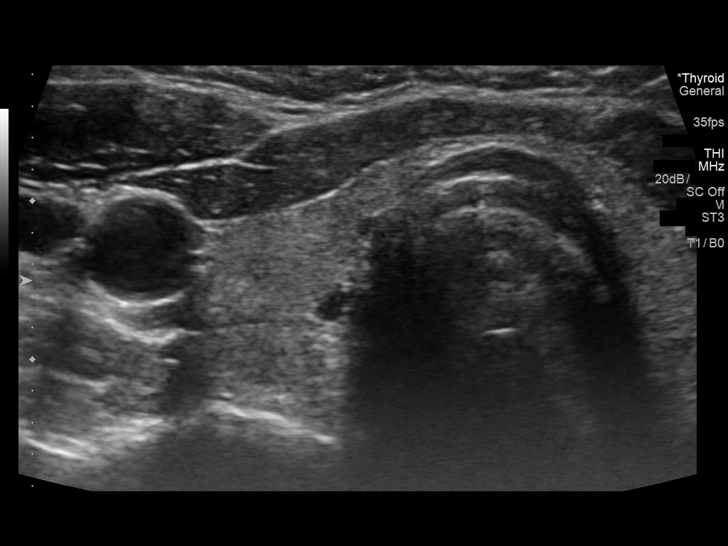
[im 29/44]
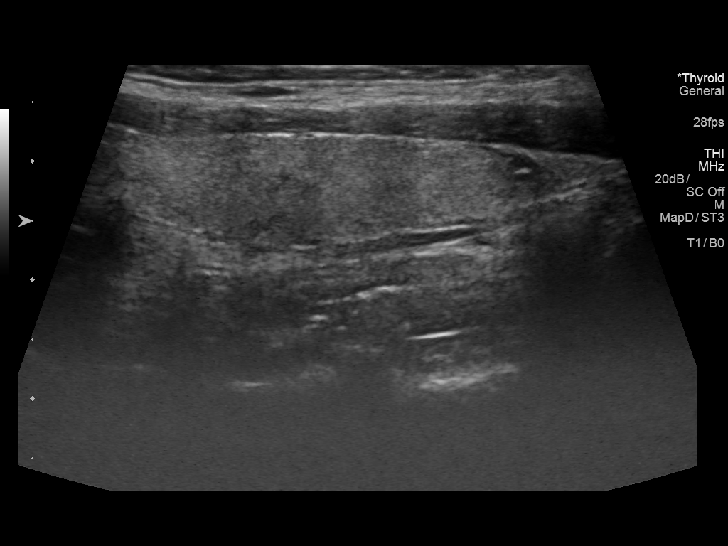
[im 33/44]
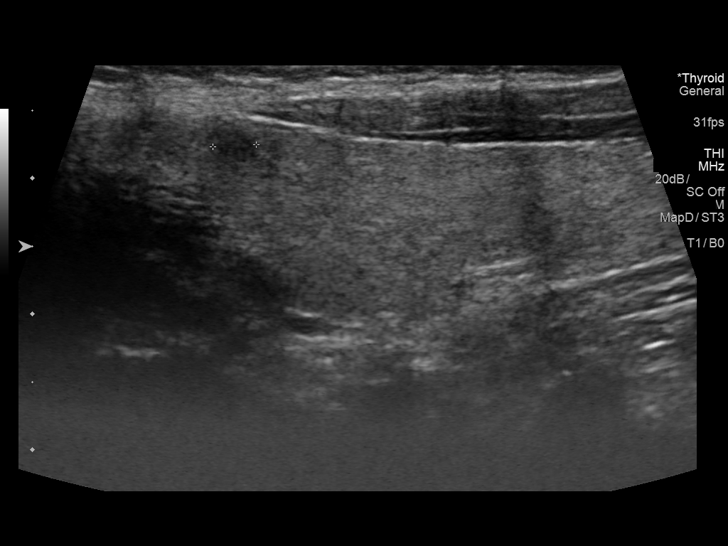
[im 36/44]
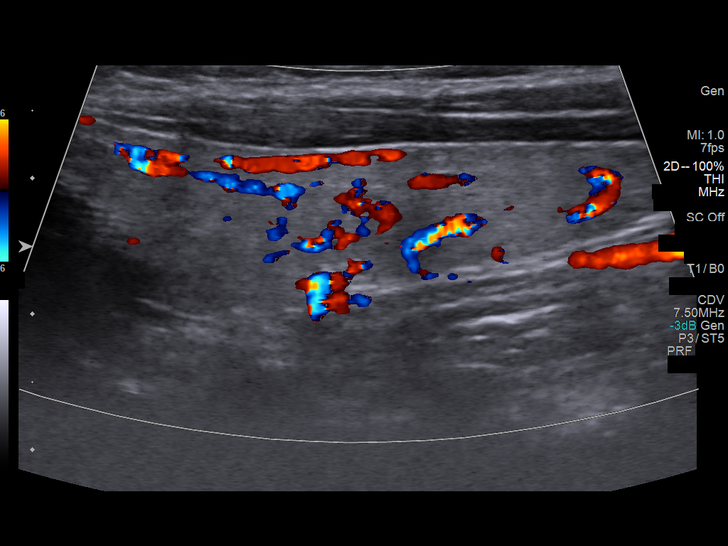
[im 40/44]
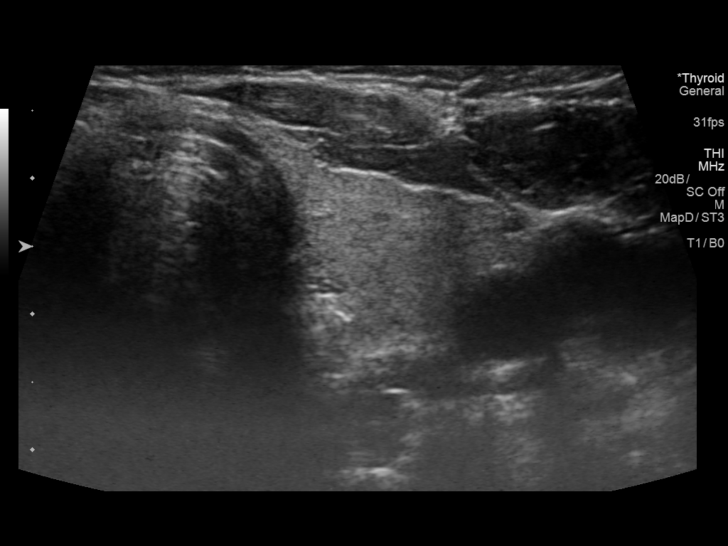
[im 44/44]
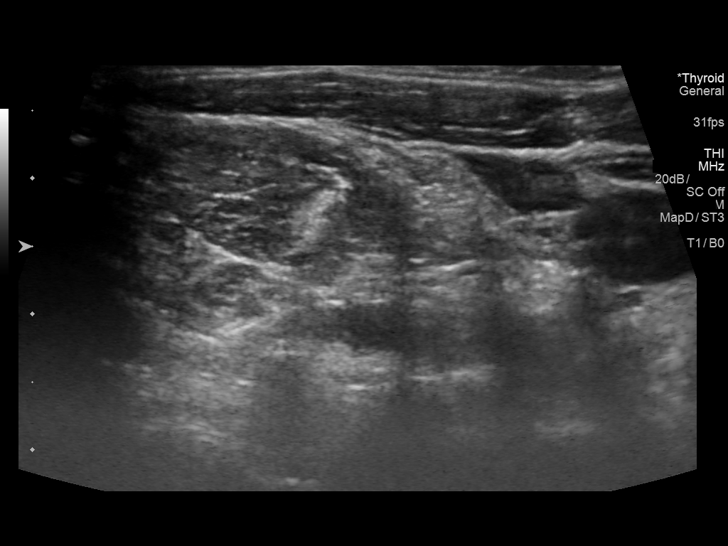

[13 of 25 positions shown; findings below may reference images not displayed]

FINDINGS: Parenchymal Echotexture: Normal

Isthmus: 0.2 cm

Right lobe: 4.6 x 1.6 x 1.2 cm

Left lobe: 5.8 x 1.3 x 1.7 cm

_________________________________________________________

Estimated total number of nodules >/= 1 cm: 1

Number of spongiform nodules >/=  2 cm not described below (TR1): 0

Number of mixed cystic and solid nodules >/= 1.5 cm not described
below (TR2): 0

_________________________________________________________

Nodule # 1:

Prior biopsy: No

Location: Isthmus; Mid, right of midline

Maximum size: 1.2 cm; Other 2 dimensions: 0.7 x 1.1 cm, previously,
1.3 x 0.9 x 1.1 cm

Composition: mixed cystic and solid (1)

Echogenicity: hypoechoic (2)

Shape: not taller-than-wide (0)

Margins: smooth (0)

Echogenic foci: none (0)

ACR TI-RADS total points: 3.

ACR TI-RADS risk category:  TR3 (3 points).

Significant change in size (>/= 20% in two dimensions and minimal
increase of 2 mm): Yes

Change in features: Yes

Change in ACR TI-RADS risk category: Yes

ACR TI-RADS recommendations:

Given size (<1.4 cm) and appearance, this nodule does NOT meet
TI-RADS criteria for biopsy or dedicated follow-up.

_______________________________________________________
IMPRESSION: 1. Significant interval involution of the solid portion of the
isthmic nodule which down grades the TI-RADS category from 4 to 3
and also results in an overall decrease in size of the now mixed
cystic and solid nodule. The current features no longer meet
criteria for further annual imaging follow-up.

The above is in keeping with the ACR TI-RADS recommendations - [HOSPITAL] 0959;[DATE].

## 2018-10-09 ENCOUNTER — Encounter: Payer: Self-pay | Admitting: Family Medicine

## 2018-10-09 ENCOUNTER — Ambulatory Visit: Payer: BLUE CROSS/BLUE SHIELD | Admitting: Family Medicine

## 2018-10-09 VITALS — BP 124/84 | HR 83 | Temp 98.5°F | Ht 68.0 in | Wt 175.5 lb

## 2018-10-09 DIAGNOSIS — Z23 Encounter for immunization: Secondary | ICD-10-CM | POA: Diagnosis not present

## 2018-10-09 DIAGNOSIS — F988 Other specified behavioral and emotional disorders with onset usually occurring in childhood and adolescence: Secondary | ICD-10-CM

## 2018-10-09 DIAGNOSIS — I10 Essential (primary) hypertension: Secondary | ICD-10-CM | POA: Insufficient documentation

## 2018-10-09 MED ORDER — AMPHETAMINE-DEXTROAMPHETAMINE 20 MG PO TABS: 20.0000 mg | ORAL_TABLET | Freq: Two times a day (BID) | ORAL | 0 refills | Status: DC

## 2018-10-09 NOTE — Assessment & Plan Note (Addendum)
Usually normal on medicaiton.. But appears BP trending up along with increased pulse.  She reports she had no breakfast and took med with Dr. Malachi Bonds this AM Follow at home.. Follow up in 3 months for re-eval..  Decrease caffeine.

## 2018-10-09 NOTE — Progress Notes (Signed)
ADD Compliant with meds:  Yes benefit from med (ie increase in concentration): YEs, helps with concentration. change in mood: denies depression., no anxiety, no SI change in appetite: none Insomnia:none tremor:none compliant with behavioral modification: yes   Wt Readings from Last 3 Encounters:  10/09/18 175 lb 8 oz (79.6 kg)  04/10/18 170 lb 8 oz (77.3 kg)  10/16/17 175 lb 8 oz (79.6 kg)     BP Readings from Last 3 Encounters:  10/09/18 120/90  04/10/18 120/86  10/16/17 102/70     PMH and SH reviewed  ROS: Per HPI unless specifically indicated in ROS section  Blood pressure 120/90, pulse (!) 104, temperature 98.5 F (36.9 C), temperature source Oral, height 5\' 8"  (1.727 m), weight 175 lb 8 oz (79.6 kg), last menstrual period 09/30/2018.  Meds, vitals, and allergies reviewed.   GEN: nad, alert and oriented, affect wnl and appropriate HEENT: mucous membranes moist NECK: supple w/o LA CV: rrr. Tachycardia PULM: ctab, no inc wob ABD: soft, +bs EXT: no edema CN 2-12 wnl, s/s/dtr wnl x4.  No tremor.

## 2018-10-09 NOTE — Patient Instructions (Addendum)
Occ check  BP at  Home.. Goal < 140/90.. Call if persistently elevated. HR goal 60-90. Decrease caffeine in diet. Increase water in take.

## 2018-10-09 NOTE — Assessment & Plan Note (Signed)
Good control on medication.Marland Kitchen Possible BP elevation SE>

## 2019-01-12 ENCOUNTER — Encounter: Payer: Self-pay | Admitting: Family Medicine

## 2019-01-12 ENCOUNTER — Ambulatory Visit: Payer: BLUE CROSS/BLUE SHIELD | Admitting: Family Medicine

## 2019-01-12 VITALS — BP 120/80 | HR 72 | Temp 98.4°F | Ht 68.0 in | Wt 183.0 lb

## 2019-01-12 DIAGNOSIS — F988 Other specified behavioral and emotional disorders with onset usually occurring in childhood and adolescence: Secondary | ICD-10-CM | POA: Diagnosis not present

## 2019-01-12 MED ORDER — AMPHETAMINE-DEXTROAMPHETAMINE 20 MG PO TABS: 20.0000 mg | ORAL_TABLET | Freq: Two times a day (BID) | ORAL | 0 refills | Status: DC

## 2019-01-12 NOTE — Progress Notes (Signed)
ADD  She graduated in 06/2018 but walked in 11/2018  Looking for jobs. Compliant with meds: good benefit from med (ie increase in concentration): yes change in mood: none change in appetite: none Insomnia: none tremor:none compliant with behavioral modification: yes PMH and SH reviewed  Wt Readings from Last 3 Encounters:  01/12/19 183 lb (83 kg)  10/09/18 175 lb 8 oz (79.6 kg)  04/10/18 170 lb 8 oz (77.3 kg)  Body mass index is 27.83 kg/m.   Has cut  Back on caffeine. Exercise: none  ROS: Per HPI unless specifically indicated in ROS section   Meds, vitals, and allergies reviewed.  Blood pressure 120/80, pulse 72, temperature 98.4 F (36.9 C), temperature source Oral, height 5\' 8"  (1.727 m), weight 183 lb (83 kg), last menstrual period 12/22/2018.    GEN: nad, alert and oriented, affect wnl and appropriate HEENT: mucous membranes moist NECK: supple w/o LA CV: rrr.  PULM: ctab, no inc wob ABD: soft, +bs EXT: no edema CN 2-12 wnl, s/s/dtr wnl x4.  No tremor.

## 2019-01-12 NOTE — Assessment & Plan Note (Signed)
Stable control. No red flags. Refilled medication x 3 months.

## 2019-04-13 ENCOUNTER — Ambulatory Visit (INDEPENDENT_AMBULATORY_CARE_PROVIDER_SITE_OTHER): Payer: Self-pay | Admitting: Family Medicine

## 2019-04-13 ENCOUNTER — Encounter: Payer: Self-pay | Admitting: Family Medicine

## 2019-04-13 VITALS — Ht 68.0 in

## 2019-04-13 DIAGNOSIS — F988 Other specified behavioral and emotional disorders with onset usually occurring in childhood and adolescence: Secondary | ICD-10-CM

## 2019-04-13 MED ORDER — AMPHETAMINE-DEXTROAMPHETAMINE 20 MG PO TABS: 20.0000 mg | ORAL_TABLET | Freq: Two times a day (BID) | ORAL | 0 refills | Status: DC

## 2019-04-13 NOTE — Progress Notes (Signed)
VIRTUAL VISIT Due to national recommendations of social distancing due to [place] 19, a virtual visit is felt to be most appropriate for this patient at this time.   I connected with the patient on 04/13/19 at 11:00 AM EDT by virtual telehealth platform and verified that I am speaking with the correct person using two identifiers.   I discussed the limitations, risks, security and privacy concerns of performing an evaluation and management service by  virtual telehealth platform and the availability of in person appointments. I also discussed with the patient that there may be a patient responsible charge related to this service. The patient expressed understanding and agreed to proceed.  Patient location: Home Provider Location: [place] Participants: Lisa Ellison and Myrle Sheng   Chief Complaint  Patient presents with  . Follow-up    ADD    History of Present Illness:  She currently does not have insurance.  ADD She is done with school she is in the process of looking for a career. 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  Compliant with meds: She is taking the adderall 20 mg daily... occ takes days off as vacation of weekends benefit from med (ie increase in concentration): change in mood: none change in appetite:none Insomnia: none tremor:none compliant with behavioral modification:     No recent weight.  Exercise: regularly. Wt Readings from Last 3 Encounters:  01/12/19 183 lb (83 kg)  10/09/18 175 lb 8 oz (79.6 kg)  04/10/18 170 lb 8 oz (77.3 kg)     No red flags... low risk PDMP review.   COVID 19 screen No recent travel or known exposure to COVID19 The patient denies respiratory symptoms of COVID 19 at this time.  The importance of social distancing was discussed today.   Social History /Family History/Past Medical History reviewed in detail and updated in EMR if needed. Height 5\' 8"  (1.727 m).  ROS    Past Medical History:  Diagnosis Date  . ADD  (attention deficit disorder)     reports that she has never smoked. She has never used smokeless tobacco. She reports current alcohol use. She reports that she does not use drugs.   Current Outpatient Medications:  .  amphetamine-dextroamphetamine (ADDERALL) 20 MG tablet, Take 1 tablet (20 mg total) by mouth 2 (two) times daily., Disp: 60 tablet, Rfl: 0 .  amphetamine-dextroamphetamine (ADDERALL) 20 MG tablet, Take 1 tablet (20 mg total) by mouth 2 (two) times daily., Disp: 60 tablet, Rfl: 0 .  amphetamine-dextroamphetamine (ADDERALL) 20 MG tablet, Take 1 tablet (20 mg total) by mouth 2 (two) times daily., Disp: 60 tablet, Rfl: 0  Current Facility-Administered Medications:  .  TDaP (BOOSTRIX) injection 0.5 mL, 0.5 mL, Intramuscular, Once, Kyasia Steuck E, MD   Observations/Objective: Height 5\' 8"  (1.727 m).  Physical Exam  Physical Exam Constitutional:      General: She is not in acute distress. Pulmonary:     Effort: Pulmonary effort is normal. No respiratory distress.  Neurological:     Mental Status: She is alert and oriented to person, place, and time.  Psychiatric:        Mood and Affect: Mood normal.        Behavior: Behavior normal.   Assessment and Plan   ADD (attention deficit disorder) Stable control on 20 mg Adderall. Looking for job.  No red flags for abuse or misuse.  Refilled for 3 months follow up at that time.   I discussed the assessment and  treatment plan with the patient. The patient was provided an opportunity to ask questions and all were answered. The patient agreed with the plan and demonstrated an understanding of the instructions.   The patient was advised to call back or seek an in-person evaluation if the symptoms worsen or if the condition fails to improve as anticipated.     Lisa Lofts, MD

## 2019-04-13 NOTE — Assessment & Plan Note (Signed)
Stable control on 20 mg Adderall. Looking for job.  No red flags for abuse or misuse.  Refilled for 3 months follow up at that time.

## 2019-04-16 ENCOUNTER — Telehealth: Payer: Self-pay | Admitting: Family Medicine

## 2019-04-16 NOTE — Telephone Encounter (Signed)
Dr Diona Browner I spoke with Lisa Ellison the approximate cost for level 2 visit $175.  With no insurance pt would get a 55% discount.   Please call pt to schedule in office follow up ADD in 3 months.Also she is self pay.. I have put in a Level 2 OV charge... please lety me know how much this will cost her

## 2019-04-16 NOTE — Telephone Encounter (Signed)
Left message asking pt to call office Please call pt to schedule in office follow up ADD in 3 months.

## 2019-04-16 NOTE — Telephone Encounter (Signed)
Okay.. I think this is appropriate.

## 2019-04-21 NOTE — Telephone Encounter (Signed)
Tried call pt  Voicemail full

## 2019-04-26 NOTE — Telephone Encounter (Signed)
Appointment 7/28 Pt aware

## 2019-07-15 ENCOUNTER — Telehealth: Payer: Self-pay | Admitting: Family Medicine

## 2019-07-15 NOTE — Telephone Encounter (Signed)
Pt called requesting a refill on Adderall to be sent to [facility] on [address]. She is completely out and has a follow up on 7/28.

## 2019-07-15 NOTE — Telephone Encounter (Signed)
Name of [place] [address]  Name of Pharmacy: [place] or Written Date and Quantity: 06/13/19 #60 Last Office Visit and Type: 04/13/19 Next Office Visit and Type: 07/27/19 Last Controlled Substance Agreement Date: 01/28/17 Last UDS:01/28/17

## 2019-07-16 MED ORDER — AMPHETAMINE-DEXTROAMPHETAMINE 20 MG PO TABS: 20.0000 mg | ORAL_TABLET | Freq: Two times a day (BID) | ORAL | 0 refills | Status: DC

## 2019-07-16 NOTE — Telephone Encounter (Signed)
Refilled x 1 .. plan 2 more refills to be given at upcoming [place].

## 2019-07-27 ENCOUNTER — Ambulatory Visit: Payer: Self-pay | Admitting: Family Medicine

## 2019-07-27 ENCOUNTER — Encounter: Payer: Self-pay | Admitting: Family Medicine

## 2019-07-27 ENCOUNTER — Ambulatory Visit (INDEPENDENT_AMBULATORY_CARE_PROVIDER_SITE_OTHER): Payer: Self-pay | Admitting: Family Medicine

## 2019-07-27 VITALS — Ht 68.0 in

## 2019-07-27 DIAGNOSIS — F325 Major depressive disorder, single episode, in full remission: Secondary | ICD-10-CM

## 2019-07-27 DIAGNOSIS — F988 Other specified behavioral and emotional disorders with onset usually occurring in childhood and adolescence: Secondary | ICD-10-CM

## 2019-07-27 MED ORDER — AMPHETAMINE-DEXTROAMPHETAMINE 20 MG PO TABS: 20.0000 mg | ORAL_TABLET | Freq: Two times a day (BID) | ORAL | 0 refills | Status: DC

## 2019-07-27 NOTE — Progress Notes (Signed)
Prescriptions placed up front for patient to pick up.

## 2019-07-27 NOTE — Assessment & Plan Note (Signed)
Very stable, no red flags. PDMP reviewed.  Given self pay.. will go to a 6 month ADD follow up schedule.  refilled meds for 3 months.. she will call end of 08/2019 for next 3 month Rx's.

## 2019-07-27 NOTE — Progress Notes (Signed)
VIRTUAL VISIT Due to national recommendations of social distancing due to [place] 19, a virtual visit is felt to be most appropriate for this patient at this time.   I connected with the patient on 07/27/19 at 11:20 AM EDT by virtual telehealth platform and verified that I am speaking with the correct person using two identifiers.   I discussed the limitations, risks, security and privacy concerns of performing an evaluation and management service by  virtual telehealth platform and the availability of in person appointments. I also discussed with the patient that there may be a patient responsible charge related to this service. The patient expressed understanding and agreed to proceed.  Patient location: Home Provider Location: [facility] [place] Participants: Eliezer Lofts and Myrle Sheng   Chief Complaint  Patient presents with  . Follow-up    ADD    History of Present Illness: ADD Compliant with meds: taking  Adderall  20  2 times daily benefit from med (ie increase in concentration): helping well change in mood:none change in appetite: good Insomnia:none tremor:none compliant with behavioral modification: good   Wt Readings from Last 3 Encounters:  01/12/19 183 lb (83 kg)  10/09/18 175 lb 8 oz (79.6 kg)  04/10/18 170 lb 8 oz (77.3 kg)    PMH and SH reviewed  ROS: Per HPI unless specifically indicated in ROS section    COVID 19 screen No recent travel or known exposure to [place] The patient denies respiratory symptoms of COVID 19 at this time.  The importance of social distancing was discussed today.   ROS    Past Medical History:  Diagnosis Date  . ADD (attention deficit disorder)     reports that she has never smoked. She has never used smokeless tobacco. She reports current alcohol use. She reports that she does not use drugs.   Current Outpatient Medications:  .  amphetamine-dextroamphetamine (ADDERALL) 20 MG tablet, Take 1 tablet (20 mg total) by mouth 2  (two) times daily., Disp: 60 tablet, Rfl: 0  Current Facility-Administered Medications:  .  TDaP (BOOSTRIX) injection 0.5 mL, 0.5 mL, Intramuscular, Once, Wilhelmena Zea E, MD   Observations/Objective: Height 5\' 8"  (4.656 m), last menstrual period 07/09/2019.  Physical Exam  Physical Exam Constitutional:      General: The patient is not in acute distress. Pulmonary:     Effort: Pulmonary effort is normal. No respiratory distress.  Neurological:     Mental Status: The patient is alert and oriented to person, place, and time.  Psychiatric:        Mood and Affect: Mood normal.        Behavior: Behavior normal.   Assessment and Plan ADD (attention deficit disorder)  Very stable, no red flags. PDMP reviewed.  Given self pay.. will go to a 6 month ADD follow up schedule.  refilled meds for 3 months.. she will call end of 08/2019 for next 3 month Rx's.  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      I discussed the assessment and treatment plan with the patient. The patient was provided an opportunity to ask questions and all were answered. The patient agreed with the plan and demonstrated an understanding of the instructions.   The patient was advised to call back or seek an in-person evaluation if the symptoms worsen or if the condition fails to improve as anticipated.     Eliezer Lofts, MD

## 2019-07-27 NOTE — Assessment & Plan Note (Signed)
Resolved

## 2019-08-04 NOTE — Progress Notes (Signed)
Left message asking pt to call office 8/5/rbh

## 2019-08-13 NOTE — Progress Notes (Signed)
Put note on rx envelope to schedule appointment when pt picks up rx rx still up front 8/14

## 2019-10-15 ENCOUNTER — Other Ambulatory Visit: Payer: Self-pay | Admitting: Family Medicine

## 2019-10-15 MED ORDER — AMPHETAMINE-DEXTROAMPHETAMINE 20 MG PO TABS: 20.0000 mg | ORAL_TABLET | Freq: Two times a day (BID) | ORAL | 0 refills | Status: DC

## 2019-10-15 NOTE — Telephone Encounter (Signed)
Patient is requesting a refill  ADDERALL   She has a few tablets left, she was not sure if she is due for refill for 17th or 18th   Patient would like the Script transferred to a new [place], [place]

## 2019-10-15 NOTE — Telephone Encounter (Signed)
Name of Medication:Adderall 20 mg  Name of Pharmacy: [facility] Last Fill or Written Date and Quantity:# 60 x 2 on 07/16/19  Last Office Visit and Type:07/27/19 FU Next Office Visit and Type: 02/22/20 for 6 mth FU

## 2019-11-16 ENCOUNTER — Telehealth: Payer: Self-pay | Admitting: Family Medicine

## 2019-11-16 NOTE — Telephone Encounter (Signed)
Patient is requesting a refill for her ADDERAL   Patient thought when she called last time that she would get enough to refills to make it until her next appointment. She didn't know if she would need to call back every month until then  Patient has enough medication until tomorrow   [place]

## 2019-11-16 NOTE — Telephone Encounter (Signed)
I spoke with pt to see if she had [facility]ked with [facility] in [place] and she said yes and [facility] said they did not have adderall 20 mg rx. I called [facility] [place] and spoke with Marya Amsler [facility] on [facility] for Adderall 20 mg and Marya Amsler will put in for rx to be filled tonight. Pt voiced understanding and appreciative.

## 2020-01-18 ENCOUNTER — Other Ambulatory Visit: Payer: Self-pay | Admitting: Family Medicine

## 2020-01-18 MED ORDER — AMPHETAMINE-DEXTROAMPHETAMINE 20 MG PO TABS: 20.0000 mg | ORAL_TABLET | Freq: Two times a day (BID) | ORAL | 0 refills | Status: DC

## 2020-01-18 NOTE — Addendum Note (Signed)
Addended by: Carter Kitten on: 01/18/2020 02:38 PM   Modules accepted: Orders

## 2020-01-18 NOTE — Telephone Encounter (Signed)
Last office visit 07/27/2019 for MDD & ADD.  Last refilled 10/15/2019 for #60 with no refills x 3 months.  UDS/Contract 01/29/2017.  Next Appt: 02/22/2020 for 6 month follow up.

## 2020-01-18 NOTE — Telephone Encounter (Signed)
Pt is requesting a refill on her Adderall to be sent to [facility] [place]. [address]

## 2020-02-22 ENCOUNTER — Ambulatory Visit (INDEPENDENT_AMBULATORY_CARE_PROVIDER_SITE_OTHER): Payer: Self-pay | Admitting: Family Medicine

## 2020-02-22 ENCOUNTER — Encounter: Payer: Self-pay | Admitting: Family Medicine

## 2020-02-22 ENCOUNTER — Other Ambulatory Visit: Payer: Self-pay

## 2020-02-22 VITALS — BP 110/78 | HR 96 | Temp 98.0°F | Ht 68.0 in | Wt 188.5 lb

## 2020-02-22 DIAGNOSIS — F988 Other specified behavioral and emotional disorders with onset usually occurring in childhood and adolescence: Secondary | ICD-10-CM

## 2020-02-22 MED ORDER — AMPHETAMINE-DEXTROAMPHETAMINE 20 MG PO TABS: 20.0000 mg | ORAL_TABLET | Freq: Two times a day (BID) | ORAL | 0 refills | Status: DC

## 2020-02-22 NOTE — Patient Instructions (Addendum)
Call for next set of prescriptions for Adderall in 3 months.  Set up physical here or with GYN when you have insurance.

## 2020-02-22 NOTE — Assessment & Plan Note (Signed)
Stable control. Given self pay.. continue 6 month ADD follow up schedule.  refilled meds for 3 months.. she will call end of 04/2020 for next 3 month Rx's.

## 2020-02-22 NOTE — Progress Notes (Signed)
Chief Complaint  Patient presents with  . ADD    History of Present Illness: HPI  ADD Compliant with meds: Yes, on adderall 20 mg twice daily benefit from med (ie increase in concentration): yes change in mood: none change in appetite: none Insomnia: none tremor:none compliant with behavioral modification: yes PDMP reviewed. No red flags.  Given self pay.. will go to a 6 month ADD follow up schedule.  refilled meds for 3 months.. she will call end of 04/2020 for next 3 month Rx's.  MDD resolved on no medication.  Wt Readings from Last 3 Encounters:  02/22/20 188 lb 8 oz (85.5 kg)  01/12/19 183 lb (83 kg)  10/09/18 175 lb 8 oz (79.6 kg)     CPX done by Dr. Radene Knee GYN   Shingles  Infection  in fall  This visit occurred during the SARS-CoV-2 public health emergency.  Safety protocols were in place, including screening questions prior to the visit, additional usage of staff PPE, and extensive cleaning of exam room while observing appropriate contact time as indicated for disinfecting solutions.   COVID 19 screen:  No recent travel or known exposure to COVID19 The patient denies respiratory symptoms of COVID 19 at this time. The importance of social distancing was discussed today.     Review of Systems  Constitutional: Negative for chills and fever.  HENT: Negative for congestion and ear pain.   Eyes: Negative for pain and redness.  Respiratory: Negative for cough and shortness of breath.   Cardiovascular: Negative for chest pain, palpitations and leg swelling.  Gastrointestinal: Negative for abdominal pain, blood in stool, constipation, diarrhea, nausea and vomiting.  Genitourinary: Negative for dysuria.  Musculoskeletal: Negative for falls and myalgias.  Skin: Negative for rash.  Neurological: Negative for dizziness.  Psychiatric/Behavioral: Negative for depression. The patient is not nervous/anxious.       Past Medical History:  Diagnosis Date  . ADD (attention  deficit disorder)     reports that she has never smoked. She has never used smokeless tobacco. She reports current alcohol use. She reports that she does not use drugs.   Current Outpatient Medications:  .  amphetamine-dextroamphetamine (ADDERALL) 20 MG tablet, Take 1 tablet (20 mg total) by mouth 2 (two) times daily., Disp: 60 tablet, Rfl: 0 .  amphetamine-dextroamphetamine (ADDERALL) 20 MG tablet, Take 1 tablet (20 mg total) by mouth 2 (two) times daily., Disp: 60 tablet, Rfl: 0 .  amphetamine-dextroamphetamine (ADDERALL) 20 MG tablet, Take 1 tablet (20 mg total) by mouth 2 (two) times daily., Disp: 60 tablet, Rfl: 0  Current Facility-Administered Medications:  .  TDaP (BOOSTRIX) injection 0.5 mL, 0.5 mL, Intramuscular, Once, Emori Mumme E, MD   Observations/Objective: Blood pressure 110/78, pulse 96, temperature 98 F (36.7 C), temperature source Temporal, height 5\' 8"  (1.727 m), weight 188 lb 8 oz (85.5 kg), last menstrual period 02/02/2020, SpO2 98 %.  Physical Exam Constitutional:      General: She is not in acute distress.    Appearance: Normal appearance. She is well-developed. She is not ill-appearing or toxic-appearing.  HENT:     Head: Normocephalic.     Right Ear: Hearing, tympanic membrane, ear canal and external ear normal. Tympanic membrane is not erythematous, retracted or bulging.     Left Ear: Hearing, tympanic membrane, ear canal and external ear normal. Tympanic membrane is not erythematous, retracted or bulging.     Nose: No mucosal edema or rhinorrhea.     Right Sinus: No maxillary sinus  tenderness or frontal sinus tenderness.     Left Sinus: No maxillary sinus tenderness or frontal sinus tenderness.     Mouth/Throat:     Pharynx: Uvula midline.  Eyes:     General: Lids are normal. Lids are everted, no foreign bodies appreciated.     Conjunctiva/sclera: Conjunctivae normal.     Pupils: Pupils are equal, round, and reactive to light.  Neck:     Thyroid: No  thyroid mass or thyromegaly.     Vascular: No carotid bruit.     Trachea: Trachea normal.  Cardiovascular:     Rate and Rhythm: Normal rate and regular rhythm.     Pulses: Normal pulses.     Heart sounds: Normal heart sounds, S1 normal and S2 normal. No murmur. No friction rub. No gallop.   Pulmonary:     Effort: Pulmonary effort is normal. No tachypnea or respiratory distress.     Breath sounds: Normal breath sounds. No decreased breath sounds, wheezing, rhonchi or rales.  Abdominal:     General: Bowel sounds are normal.     Palpations: Abdomen is soft.     Tenderness: There is no abdominal tenderness.  Musculoskeletal:     Cervical back: Normal range of motion and neck supple.  Skin:    General: Skin is warm and dry.     Findings: No rash.  Neurological:     Mental Status: She is alert.  Psychiatric:        Mood and Affect: Mood is not anxious or depressed.        Speech: Speech normal.        Behavior: Behavior normal. Behavior is cooperative.        Thought Content: Thought content normal.        Judgment: Judgment normal.      Assessment and Plan      Eliezer Lofts, MD

## 2020-07-24 ENCOUNTER — Telehealth: Payer: Self-pay | Admitting: Family Medicine

## 2020-07-24 NOTE — Telephone Encounter (Signed)
Patient said the rx for her Adderral was sent to pharmacy for 5 months instead of 6 months.Patient called to get a refill on Adderral for 1 month.  [facility] uses [facility].  Patient is out of medication.

## 2020-07-24 NOTE — Telephone Encounter (Signed)
Last office visit 02/22/2020 for  ADD.  Last refill on file states do no fill until 06/21/2020.  Next appt scheduled for 08/22/2020.

## 2020-07-25 ENCOUNTER — Other Ambulatory Visit: Payer: Self-pay

## 2020-07-25 NOTE — Telephone Encounter (Signed)
Patient called in checking on status. Please advise and call when sent in. Patient states she just wants to sleep as she has been up for 4 days.

## 2020-07-26 ENCOUNTER — Encounter: Payer: Self-pay | Admitting: *Deleted

## 2020-07-26 MED ORDER — AMPHETAMINE-DEXTROAMPHETAMINE 20 MG PO TABS: 20.0000 mg | ORAL_TABLET | Freq: Two times a day (BID) | ORAL | 0 refills | Status: DC

## 2020-07-26 NOTE — Telephone Encounter (Signed)
Patient contacted the office back. She states she is not having any issues with anxiety - she states that she was just really concerned with not having the medication on time - and was upset with [facility] because she was getting mixed information for a little over 4 days about her medication refill.  She denies any anxiety issues, and and states that he rx has been sent in now, and that she is okay. Will route back to [place].

## 2020-07-26 NOTE — Telephone Encounter (Signed)
Left message for Lisa Ellison to return my call.  

## 2020-07-26 NOTE — Telephone Encounter (Signed)
Prescriptions sent in. Not sure why not taking it would keep her up all night... please ask if she is having other issue like anxiety going on.

## 2020-08-22 ENCOUNTER — Encounter: Payer: Self-pay | Admitting: Family Medicine

## 2020-08-22 ENCOUNTER — Other Ambulatory Visit: Payer: Self-pay

## 2020-08-22 ENCOUNTER — Ambulatory Visit: Payer: Self-pay | Admitting: Family Medicine

## 2020-08-22 VITALS — BP 100/60 | HR 77 | Temp 98.2°F | Ht 68.0 in | Wt 198.5 lb

## 2020-08-22 DIAGNOSIS — G43001 Migraine without aura, not intractable, with status migrainosus: Secondary | ICD-10-CM

## 2020-08-22 DIAGNOSIS — Z3A01 Less than 8 weeks gestation of pregnancy: Secondary | ICD-10-CM | POA: Insufficient documentation

## 2020-08-22 DIAGNOSIS — F988 Other specified behavioral and emotional disorders with onset usually occurring in childhood and adolescence: Secondary | ICD-10-CM

## 2020-08-22 NOTE — Assessment & Plan Note (Addendum)
She was counseled to remain off Adderall. She will follow up after pregnancy.  Info on early pregnancy and common questions given.

## 2020-08-22 NOTE — Assessment & Plan Note (Signed)
Reviewed pain meds safe in pregnancy... tylenol for HA.

## 2020-08-22 NOTE — Progress Notes (Signed)
ADD Compliant with meds: Given she is pregnant she has not been taking the medication in last 4-5 day. Tapered down to one a day prior. Doing poorly off the medication.. concentration, trouble getting tasks done.   She is tired given she is pregnant.  benefit from med (ie increase in concentration): change in mood: change in appetite: Insomnia: tremor: compliant with behavioral modification:   She is [redacted] week pregnant!.  She is taking prenatal vitamin.  She is setting up Medicaid... planning to go to a Medicaid provider.  Given self pay.. will go to a 6 month ADD follow up schedule. refilled meds for 3 months.. she will call end of 10/2020 for next 3 month Rx's. PMH and SH reviewed  ROS: Per HPI unless specifically indicated in ROS section   Meds, vitals, and allergies reviewed.   GEN: nad, alert and oriented, affect wnl and appropriate HEENT: mucous membranes moist NECK: supple w/o LA CV: rrr.  PULM: ctab, no inc wob ABD: soft, +bs EXT: no edema CN 2-12 wnl, s/s/dtr wnl x4.  No tremor.  Less than [redacted] weeks gestation of pregnancy She was counseled to remain off Adderall. She will follow up after pregnancy.  Info on early pregnancy and common questions given.  Attention deficit disorder (ADD) without hyperactivity  Behavioral treatment. Remain off adderall.  Common migraine Reviewed pain meds safe in pregnancy... tylenol for HA.

## 2020-08-22 NOTE — Patient Instructions (Signed)
Remain off Adderall until after pregnancy. Continue prenatal vitamin.  Common Medications Safe in Pregnancy  Acne:      Constipation:  Benzoyl Peroxide     Colace  Clindamycin      Dulcolax Suppository  Topica Erythromycin     Fibercon  Salicylic Acid      Metamucil         Miralax AVOID:        Senakot   Accutane    Cough:  Retin-A       Cough Drops  Tetracycline      Phenergan w/ Codeine if Rx  Minocycline      Robitussin (Plain & DM)  Antibiotics:     Crabs/Lice:  Ceclor       RID  Cephalosporins    AVOID:  E-Mycins      Kwell  Keflex  Macrobid/Macrodantin   Diarrhea:  Penicillin      Kao-Pectate  Zithromax      Imodium AD         PUSH FLUIDS AVOID:       Cipro     Fever:  Tetracycline      Tylenol (Regular or Extra  Minocycline       Strength)  Levaquin      Extra Strength-Do not          Exceed 8 tabs/24 hrs Caffeine:        '200mg'$ /day (equiv. To 1 cup of coffee or  approx. 3 12 oz sodas)         Gas: Cold/Hayfever:       Gas-X  Benadryl      Mylicon  Claritin       Phazyme  **Claritin-D        Chlor-Trimeton    Headaches:  Dimetapp      ASA-Free Excedrin  Drixoral-Non-Drowsy     Cold Compress  Mucinex (Guaifenasin)     Tylenol (Regular or Extra  Sudafed/Sudafed-12 Hour     Strength)  **Sudafed PE Pseudoephedrine   Tylenol Cold & Sinus     Vicks Vapor Rub  Zyrtec  **AVOID if Problems With Blood Pressure  Heartburn: Avoid lying down for at least 1 hour after meals  Aciphex      Maalox     Rash:  Milk of Magnesia     Benadryl    Mylanta       1% Hydrocortisone Cream  Pepcid  Pepcid Complete   Sleep Aids:  Prevacid      Ambien   Prilosec       Benadryl  Rolaids       Chamomile Tea  Tums (Limit 4/day)     Unisom         Tylenol PM         Warm milk-add vanilla or  Hemorrhoids:       Sugar for taste  Anusol/Anusol H.C.  (RX: Analapram 2.5%)  Sugar Substitutes:  Hydrocortisone OTC     Ok in moderation  Preparation H      Tucks        Vaseline  lotion applied to tissue with wiping    Herpes:     Throat:  Acyclovir      Oragel  Famvir  Valtrex     Vaccines:         Flu Shot Leg Cramps:       *Gardasil  Benadryl      Hepatitis A         Hepatitis  B Nasal Spray:       Pneumovax  Saline Nasal Spray     Polio Booster         Tetanus Nausea:       Tuberculosis test or PPD  Vitamin B6 25 mg TID   AVOID:    Dramamine      *Gardasil  Emetrol       Live Poliovirus  Ginger Root 250 mg QID    MMR (measles, mumps &  High Complex Carbs @ Bedtime    rebella)  Sea Bands-Accupressure    Varicella (Chickenpox)  Unisom 1/2 tab TID     *No known complications           If received before Pain:         Known pregnancy;   Darvocet       Resume series after  Lortab        Delivery  Percocet    Yeast:   Tramadol      Femstat  Tylenol 3      Gyne-lotrimin  Ultram       Monistat  Vicodin           MISC:         All Sunscreens           Hair Coloring/highlights          Insect Repellant's          (Including DEET)         Mystic Tans  Commonly Asked Questions During Pregnancy  Cats: A parasite can be excreted in cat feces.  To avoid exposure you need to have another person empty the little box.  If you must empty the litter box you will need to wear gloves.  Wash your hands after handling your cat.  This parasite can also be found in raw or undercooked meat so this should also be avoided.  Colds, Sore Throats, Flu: Please check your medication sheet to see what you can take for symptoms.  If your symptoms are unrelieved by these medications please call the office.  Dental Work: Most any dental work Agricultural consultant recommends is permitted.  X-rays should only be taken during the first trimester if absolutely necessary.  Your abdomen should be shielded with a lead apron during all x-rays.  Please notify your provider prior to receiving any x-rays.  Novocaine is fine; gas is not recommended.  If your dentist requires a note from [place] prior to  dental work please call the office and we will provide one for you.  Exercise: Exercise is an important part of staying healthy during your pregnancy.  You may continue most exercises you were accustomed to prior to pregnancy.  Later in your pregnancy you will most likely notice you have difficulty with activities requiring balance like riding a bicycle.  It is important that you listen to your body and avoid activities that put you at a higher risk of falling.  Adequate rest and staying well hydrated are a must!  If you have questions about the safety of specific activities ask your provider.    Exposure to Children with illness: Try to avoid obvious exposure; report any symptoms to [place] when noted,  If you have chicken pos, red measles or mumps, you should be immune to these diseases.   Please do not take any vaccines while pregnant unless you have checked with your OB provider.  Fetal Movement: After 28 weeks we recommend you do "kick counts"  twice daily.  Lie or sit down in a calm quiet environment and count your baby movements "kicks".  You should feel your baby at least 10 times per hour.  If you have not felt 10 kicks within the first hour get up, walk around and have something sweet to eat or drink then repeat for an additional hour.  If count remains less than 10 per hour notify your provider.  Fumigating: Follow your pest control agent's advice as to how long to stay out of your home.  Ventilate the area well before re-entering.  Hemorrhoids:   Most over-the-counter preparations can be used during pregnancy.  Check your medication to see what is safe to use.  It is important to use a stool softener or fiber in your diet and to drink lots of liquids.  If hemorrhoids seem to be getting worse please call the office.   Hot Tubs:  Hot tubs Jacuzzis and saunas are not recommended while pregnant.  These increase your internal body temperature and should be avoided.  Intercourse:  Sexual intercourse is  safe during pregnancy as long as you are comfortable, unless otherwise advised by your provider.  Spotting may occur after intercourse; report any bright red bleeding that is heavier than spotting.  Labor:  If you know that you are in labor, please go to the hospital.  If you are unsure, please call the office and let us help you decide what to do.  Lifting, straining, etc:  If your job requires heavy lifting or straining please check with your provider for any limitations.  Generally, you should not lift items heavier than that you can lift simply with your hands and arms (no back muscles)  Painting:  Paint fumes do not harm your pregnancy, but may make you ill and should be avoided if possible.  Latex or water based paints have less odor than oils.  Use adequate ventilation while painting.  Permanents & Hair Color:  Chemicals in hair dyes are not recommended as they cause increase hair dryness which can increase hair loss during pregnancy.  " Highlighting" and permanents are allowed.  Dye may be absorbed differently and permanents may not hold as well during pregnancy.  Sunbathing:  Use a sunscreen, as skin burns easily during pregnancy.  Drink plenty of fluids; avoid over heating.  Tanning Beds:  Because their possible side effects are still unknown, tanning beds are not recommended.  Ultrasound Scans:  Routine ultrasounds are performed at approximately 20 weeks.  You will be able to see your baby's general anatomy an if you would like to know the gender this can usually be determined as well.  If it is questionable when you conceived you may also receive an ultrasound early in your pregnancy for dating purposes.  Otherwise ultrasound exams are not routinely performed unless there is a medical necessity.  Although you can request a scan we ask that you pay for it when conducted because insurance does not cover " patient request" scans.  Work: If your pregnancy proceeds without complications you  may work until your due date, unless your physician or employer advises otherwise.  Round Ligament Pain/Pelvic Discomfort:  Sharp, shooting pains not associated with bleeding are fairly common, usually occurring in the second trimester of pregnancy.  They tend to be worse when standing up or when you remain standing for long periods of time.  These are the result of pressure of certain pelvic ligaments called "round ligaments".  Rest, Tylenol and heat  seem to be the most effective relief.  As the womb and fetus grow, they rise out of the pelvis and the discomfort improves.  Please notify the office if your pain seems different than that described.  It may represent a more serious condition.

## 2020-08-22 NOTE — Assessment & Plan Note (Signed)
Behavioral treatment. Remain off adderall.

## 2020-10-31 LAB — OB RESULTS CONSOLE RPR
RPR: NONREACTIVE
RPR: NONREACTIVE

## 2020-10-31 LAB — OB RESULTS CONSOLE HEPATITIS B SURFACE ANTIGEN: Hepatitis B Surface Ag: NEGATIVE

## 2020-10-31 LAB — OB RESULTS CONSOLE ABO/RH: RH Type: POSITIVE

## 2020-10-31 LAB — OB RESULTS CONSOLE GC/CHLAMYDIA
Chlamydia: NEGATIVE
Chlamydia: NEGATIVE
Gonorrhea: NEGATIVE
Gonorrhea: NEGATIVE

## 2020-10-31 LAB — OB RESULTS CONSOLE ANTIBODY SCREEN: Antibody Screen: NEGATIVE

## 2020-10-31 LAB — OB RESULTS CONSOLE RUBELLA ANTIBODY, IGM: Rubella: IMMUNE

## 2020-10-31 LAB — OB RESULTS CONSOLE HIV ANTIBODY (ROUTINE TESTING): HIV: NONREACTIVE

## 2020-11-14 LAB — HM PAP SMEAR: HM Pap smear: NEGATIVE

## 2020-12-27 ENCOUNTER — Other Ambulatory Visit: Payer: Self-pay | Admitting: Endocrinology

## 2020-12-27 DIAGNOSIS — E041 Nontoxic single thyroid nodule: Secondary | ICD-10-CM

## 2020-12-30 NOTE — L&D Delivery Note (Signed)
Delivery Note At 7:17 AM a viable female was delivered via Vaginal, Spontaneous (Presentation: Right Occiput Anterior).  APGAR: 4, 8; weight pending.   Placenta status: Spontaneous, Intact.  Cord: 3 vessels with the following complications: None.  Cord pH: n/a  Anesthesia: Epidural Episiotomy: None Lacerations:  2nd degree Suture Repair: 3.0 vicryl rapide Est. Blood Loss (mL):  400  Mom to postpartum.  Baby to Couplet care / Skin to Skin.  [name] 04/17/2021, 7:45 AM

## 2021-01-17 ENCOUNTER — Ambulatory Visit
Admission: RE | Admit: 2021-01-17 | Discharge: 2021-01-17 | Disposition: A | Discharge status: Home / Self Care | Payer: Managed Care, Other (non HMO) | Source: Ambulatory Visit | Attending: Endocrinology | Admitting: Endocrinology

## 2021-01-17 DIAGNOSIS — E041 Nontoxic single thyroid nodule: Secondary | ICD-10-CM

## 2021-01-24 ENCOUNTER — Other Ambulatory Visit: Payer: Self-pay | Admitting: Endocrinology

## 2021-01-24 DIAGNOSIS — E041 Nontoxic single thyroid nodule: Secondary | ICD-10-CM

## 2021-02-02 ENCOUNTER — Other Ambulatory Visit (HOSPITAL_COMMUNITY)
Admission: RE | Admit: 2021-02-02 | Discharge: 2021-02-02 | Disposition: A | Discharge status: Home / Self Care | Payer: Managed Care, Other (non HMO) | Source: Ambulatory Visit | Attending: Radiology | Admitting: Radiology

## 2021-02-02 ENCOUNTER — Ambulatory Visit
Admission: RE | Admit: 2021-02-02 | Discharge: 2021-02-02 | Disposition: A | Discharge status: Home / Self Care | Payer: Managed Care, Other (non HMO) | Source: Ambulatory Visit | Attending: Endocrinology | Admitting: Endocrinology

## 2021-02-02 DIAGNOSIS — E041 Nontoxic single thyroid nodule: Secondary | ICD-10-CM | POA: Diagnosis present

## 2021-02-02 DIAGNOSIS — D497 Neoplasm of unspecified behavior of endocrine glands and other parts of nervous system: Secondary | ICD-10-CM | POA: Diagnosis not present

## 2021-02-05 LAB — CYTOLOGY - NON PAP

## 2021-02-20 ENCOUNTER — Encounter (HOSPITAL_COMMUNITY): Payer: Self-pay

## 2021-03-13 LAB — OB RESULTS CONSOLE GBS: GBS: NEGATIVE

## 2021-04-05 ENCOUNTER — Encounter (HOSPITAL_COMMUNITY): Payer: Self-pay | Admitting: *Deleted

## 2021-04-05 ENCOUNTER — Telehealth (HOSPITAL_COMMUNITY): Payer: Self-pay | Admitting: *Deleted

## 2021-04-05 NOTE — Telephone Encounter (Signed)
Preadmission screen  

## 2021-04-06 ENCOUNTER — Telehealth (HOSPITAL_COMMUNITY): Payer: Self-pay | Admitting: *Deleted

## 2021-04-06 NOTE — Telephone Encounter (Signed)
Preadmission screen  

## 2021-04-09 ENCOUNTER — Telehealth (HOSPITAL_COMMUNITY): Payer: Self-pay | Admitting: *Deleted

## 2021-04-09 NOTE — Telephone Encounter (Signed)
Preadmission screen  

## 2021-04-11 ENCOUNTER — Telehealth (HOSPITAL_COMMUNITY): Payer: Self-pay | Admitting: *Deleted

## 2021-04-11 ENCOUNTER — Encounter (HOSPITAL_COMMUNITY): Payer: Self-pay | Admitting: *Deleted

## 2021-04-11 NOTE — Telephone Encounter (Signed)
Preadmission screen  

## 2021-04-13 ENCOUNTER — Other Ambulatory Visit (HOSPITAL_COMMUNITY)
Admission: RE | Admit: 2021-04-13 | Discharge: 2021-04-13 | Disposition: A | Discharge status: Home / Self Care | Payer: Managed Care, Other (non HMO) | Source: Ambulatory Visit | Attending: Obstetrics & Gynecology | Admitting: Obstetrics & Gynecology

## 2021-04-13 DIAGNOSIS — Z01812 Encounter for preprocedural laboratory examination: Secondary | ICD-10-CM | POA: Insufficient documentation

## 2021-04-13 DIAGNOSIS — Z20822 Contact with and (suspected) exposure to covid-19: Secondary | ICD-10-CM | POA: Insufficient documentation

## 2021-04-13 LAB — SARS CORONAVIRUS 2 (TAT 6-24 HRS): SARS Coronavirus 2: NEGATIVE

## 2021-04-16 ENCOUNTER — Other Ambulatory Visit: Payer: Self-pay

## 2021-04-16 ENCOUNTER — Inpatient Hospital Stay (HOSPITAL_COMMUNITY): Payer: Managed Care, Other (non HMO)

## 2021-04-16 ENCOUNTER — Encounter (HOSPITAL_COMMUNITY): Payer: Self-pay | Admitting: Obstetrics and Gynecology

## 2021-04-16 ENCOUNTER — Inpatient Hospital Stay (HOSPITAL_COMMUNITY): Payer: Managed Care, Other (non HMO) | Admitting: Anesthesiology

## 2021-04-16 ENCOUNTER — Inpatient Hospital Stay (HOSPITAL_COMMUNITY)
Admission: AD | Admit: 2021-04-16 | Discharge: 2021-04-19 | DRG: 807 | Disposition: A | Discharge status: Home / Self Care | Payer: Managed Care, Other (non HMO) | Attending: Obstetrics & Gynecology | Admitting: Obstetrics & Gynecology

## 2021-04-16 DIAGNOSIS — Z3A41 41 weeks gestation of pregnancy: Secondary | ICD-10-CM | POA: Diagnosis not present

## 2021-04-16 DIAGNOSIS — O99214 Obesity complicating childbirth: Secondary | ICD-10-CM | POA: Diagnosis present

## 2021-04-16 DIAGNOSIS — O480 Post-term pregnancy: Secondary | ICD-10-CM | POA: Diagnosis present

## 2021-04-16 DIAGNOSIS — Z20822 Contact with and (suspected) exposure to covid-19: Secondary | ICD-10-CM | POA: Diagnosis present

## 2021-04-16 DIAGNOSIS — Z3490 Encounter for supervision of normal pregnancy, unspecified, unspecified trimester: Secondary | ICD-10-CM

## 2021-04-16 LAB — TYPE AND SCREEN
ABO/RH(D): O POS
Antibody Screen: NEGATIVE

## 2021-04-16 LAB — CBC
HCT: 33.3 % — ABNORMAL LOW (ref 36.0–46.0)
Hemoglobin: 10.1 g/dL — ABNORMAL LOW (ref 12.0–15.0)
MCH: 24.3 pg — ABNORMAL LOW (ref 26.0–34.0)
MCHC: 30.3 g/dL (ref 30.0–36.0)
MCV: 80 fL (ref 80.0–100.0)
Platelets: 285 10*3/uL (ref 150–400)
RBC: 4.16 MIL/uL (ref 3.87–5.11)
RDW: 14.6 % (ref 11.5–15.5)
WBC: 15.1 10*3/uL — ABNORMAL HIGH (ref 4.0–10.5)
nRBC: 0 % (ref 0.0–0.2)

## 2021-04-16 LAB — PROTEIN / CREATININE RATIO, URINE
Creatinine, Urine: 165.65 mg/dL
Protein Creatinine Ratio: 0.13 mg/mg{Cre} (ref 0.00–0.15)
Total Protein, Urine: 21 mg/dL

## 2021-04-16 LAB — COMPREHENSIVE METABOLIC PANEL
ALT: 18 U/L (ref 0–44)
AST: 30 U/L (ref 15–41)
Albumin: 2.5 g/dL — ABNORMAL LOW (ref 3.5–5.0)
Alkaline Phosphatase: 168 U/L — ABNORMAL HIGH (ref 38–126)
Anion gap: 9 (ref 5–15)
BUN: 9 mg/dL (ref 6–20)
CO2: 22 mmol/L (ref 22–32)
Calcium: 9.1 mg/dL (ref 8.9–10.3)
Chloride: 104 mmol/L (ref 98–111)
Creatinine, Ser: 0.72 mg/dL (ref 0.44–1.00)
GFR, Estimated: >60 mL/min (ref >60)
Glucose, Bld: 111 mg/dL — ABNORMAL HIGH (ref 70–99)
Potassium: 4.1 mmol/L (ref 3.5–5.1)
Sodium: 135 mmol/L (ref 135–145)
Total Bilirubin: 0.3 mg/dL (ref 0.3–1.2)
Total Protein: 5.9 g/dL — ABNORMAL LOW (ref 6.5–8.1)

## 2021-04-16 LAB — RPR: RPR Ser Ql: NONREACTIVE

## 2021-04-16 MED ORDER — LACTATED RINGERS IV SOLN: 500.0000 mL | Freq: Once | INTRAVENOUS | Status: DC

## 2021-04-16 MED ORDER — OXYTOCIN BOLUS FROM INFUSION
333.0000 mL | Freq: Once | INTRAVENOUS | Status: AC
  Administered 2021-04-17: 333 mL via INTRAVENOUS

## 2021-04-16 MED ORDER — OXYTOCIN-SODIUM CHLORIDE 30-0.9 UT/500ML-% IV SOLN
1.0000 m[IU]/min | INTRAVENOUS | Status: DC
  Administered 2021-04-16: 2 m[IU]/min via INTRAVENOUS

## 2021-04-16 MED ORDER — OXYCODONE-ACETAMINOPHEN 5-325 MG PO TABS
2.0000 | ORAL_TABLET | ORAL | Status: DC | PRN
Start: 2021-04-16 — End: 2021-04-17
  Administered 2021-04-17: 2 via ORAL
  Filled 2021-04-16: qty 2

## 2021-04-16 MED ORDER — PHENYLEPHRINE 40 MCG/ML (10ML) SYRINGE FOR IV PUSH (FOR BLOOD PRESSURE SUPPORT): 80.0000 ug | PREFILLED_SYRINGE | INTRAVENOUS | Status: DC | PRN

## 2021-04-16 MED ORDER — ACETAMINOPHEN 325 MG PO TABS: 650.0000 mg | ORAL_TABLET | ORAL | Status: DC | PRN

## 2021-04-16 MED ORDER — LIDOCAINE HCL (PF) 1 % IJ SOLN
30.0000 mL | INTRAMUSCULAR | Status: AC | PRN
  Administered 2021-04-17: 30 mL via SUBCUTANEOUS
  Filled 2021-04-16: qty 30

## 2021-04-16 MED ORDER — TERBUTALINE SULFATE 1 MG/ML IJ SOLN: 0.2500 mg | Freq: Once | INTRAMUSCULAR | Status: DC | PRN

## 2021-04-16 MED ORDER — SODIUM BICARBONATE 8.4 % IV SOLN
INTRAVENOUS | Status: DC | PRN
  Administered 2021-04-16: 4 mL via EPIDURAL

## 2021-04-16 MED ORDER — ONDANSETRON HCL 4 MG/2ML IJ SOLN
4.0000 mg | Freq: Four times a day (QID) | INTRAMUSCULAR | Status: DC | PRN
  Administered 2021-04-16 – 2021-04-17 (×2): 4 mg via INTRAVENOUS
  Filled 2021-04-16 (×2): qty 2

## 2021-04-16 MED ORDER — FENTANYL-BUPIVACAINE-NACL 0.5-0.125-0.9 MG/250ML-% EP SOLN
12.0000 mL/h | EPIDURAL | Status: DC | PRN
  Administered 2021-04-16: 12 mL/h via EPIDURAL
  Filled 2021-04-16: qty 250

## 2021-04-16 MED ORDER — OXYTOCIN-SODIUM CHLORIDE 30-0.9 UT/500ML-% IV SOLN
2.5000 [IU]/h | INTRAVENOUS | Status: DC
  Administered 2021-04-17: 2.5 [IU]/h via INTRAVENOUS
  Filled 2021-04-16 (×2): qty 500

## 2021-04-16 MED ORDER — HYDROXYZINE HCL 50 MG PO TABS: 50.0000 mg | ORAL_TABLET | Freq: Four times a day (QID) | ORAL | Status: DC | PRN

## 2021-04-16 MED ORDER — OXYCODONE-ACETAMINOPHEN 5-325 MG PO TABS: 1.0000 | ORAL_TABLET | ORAL | Status: DC | PRN

## 2021-04-16 MED ORDER — DIPHENHYDRAMINE HCL 50 MG/ML IJ SOLN
12.5000 mg | INTRAMUSCULAR | Status: DC | PRN
Start: 2021-04-16 — End: 2021-04-17

## 2021-04-16 MED ORDER — BUTORPHANOL TARTRATE 1 MG/ML IJ SOLN
1.0000 mg | INTRAMUSCULAR | Status: DC | PRN
Start: 2021-04-16 — End: 2021-04-17
  Administered 2021-04-16 (×2): 1 mg via INTRAVENOUS
  Filled 2021-04-16 (×2): qty 1

## 2021-04-16 MED ORDER — LACTATED RINGERS IV SOLN
500.0000 mL | INTRAVENOUS | Status: DC | PRN
  Administered 2021-04-17: 1000 mL via INTRAVENOUS

## 2021-04-16 MED ORDER — LACTATED RINGERS IV SOLN: INTRAVENOUS | Status: DC

## 2021-04-16 MED ORDER — PHENYLEPHRINE 40 MCG/ML (10ML) SYRINGE FOR IV PUSH (FOR BLOOD PRESSURE SUPPORT)
80.0000 ug | PREFILLED_SYRINGE | INTRAVENOUS | Status: DC | PRN
  Filled 2021-04-16 (×2): qty 10

## 2021-04-16 MED ORDER — EPHEDRINE 5 MG/ML INJ: 10.0000 mg | INTRAVENOUS | Status: DC | PRN

## 2021-04-16 MED ORDER — MISOPROSTOL 25 MCG QUARTER TABLET
25.0000 ug | ORAL_TABLET | ORAL | Status: DC | PRN
  Administered 2021-04-16 (×2): 25 ug via VAGINAL
  Filled 2021-04-16 (×3): qty 1

## 2021-04-16 MED ORDER — SOD CITRATE-CITRIC ACID 500-334 MG/5ML PO SOLN: 30.0000 mL | ORAL | Status: DC | PRN

## 2021-04-16 MED ORDER — OXYTOCIN-SODIUM CHLORIDE 30-0.9 UT/500ML-% IV SOLN: 1.0000 m[IU]/min | INTRAVENOUS | Status: DC

## 2021-04-16 NOTE — Anesthesia Procedure Notes (Signed)
Epidural Patient location during procedure: OB Start time: 04/16/2021 6:18 PM End time: 04/16/2021 6:30 PM  Staffing Anesthesiologist: Freddrick March, MD Performed: anesthesiologist   Preanesthetic Checklist Completed: patient identified, IV checked, risks and benefits discussed, monitors and equipment checked, pre-op evaluation and timeout performed  Epidural Patient position: sitting Prep: DuraPrep and site prepped and draped Patient monitoring: continuous pulse ox, blood pressure, heart rate and cardiac monitor Approach: midline Location: L3-L4 Injection technique: LOR air  Needle:  Needle type: Tuohy  Needle gauge: 17 G Needle length: 9 cm Needle insertion depth: 6 cm Catheter type: closed end flexible Catheter size: 19 Gauge Catheter at skin depth: 11 cm Test dose: negative  Assessment Sensory level: T8 Events: blood not aspirated, injection not painful, no injection resistance, no paresthesia and negative IV test  Additional Notes Patient identified. Risks/Benefits/Options discussed with patient including but not limited to bleeding, infection, nerve damage, paralysis, failed block, incomplete pain control, headache, blood pressure changes, nausea, vomiting, reactions to medication both or allergic, itching and postpartum back pain. Confirmed with bedside nurse the patient's most recent platelet count. Confirmed with patient that they are not currently taking any anticoagulation, have any bleeding history or any family history of bleeding disorders. Patient expressed understanding and wished to proceed. All questions were answered. Sterile technique was used throughout the entire procedure. Please see nursing notes for vital signs. Test dose was given through epidural catheter and negative prior to continuing to dose epidural or start infusion. Warning signs of high block given to the patient including shortness of breath, tingling/numbness in hands, complete motor block,  or any concerning symptoms with instructions to call for help. Patient was given instructions on fall risk and not to get out of bed. All questions and concerns addressed with instructions to call with any issues or inadequate analgesia.  Reason for block:procedure for pain

## 2021-04-16 NOTE — Progress Notes (Signed)
CVX 1/50/-2, vtx well applied Foley bulb placed and pitocin ordered [place], DO

## 2021-04-16 NOTE — Progress Notes (Signed)
Lisa Ellison is a 30 y.o. G2P0010 at [redacted]w[redacted]d by ultrasound admitted for induction of labor due to Post dates. Due date 04/08/21.  Subjective: Feeling mild CTX.  Objective: BP 125/82   Pulse (!) 102   Temp 97.9 F (36.6 C) (Oral)   Resp 16   Ht 5\' 9"  (1.753 m)   Wt 122.9 kg   LMP 07/02/2020   BMI 40.02 kg/m  No intake/output data recorded. No intake/output data recorded.  FHT:  FHR: 120 bpm, variability: moderate,  accelerations:  Present,  decelerations:  Absent UC:   regular, every 2 minutes SVE:   Dilation: 3.5 Effacement (%): 50 Station: -3 Exam by:: Dr. Lynnette Caffey AROM, clear fluid  Labs: Lab Results  Component Value Date   WBC 15.1 (H) 04/16/2021   HGB 10.1 (L) 04/16/2021   HCT 33.3 (L) 04/16/2021   MCV 80.0 04/16/2021   PLT 285 04/16/2021    Assessment / Plan: Induction of labor due to postterm,  progressing well on pitocin  Labor: Progressing normally Preeclampsia:  n/a Fetal Wellbeing:  Category I Pain Control:  IV pain meds I/D:  n/a Anticipated MOD:  NSVD  [name] 04/16/2021, 4:59 PM

## 2021-04-16 NOTE — Anesthesia Preprocedure Evaluation (Signed)
Anesthesia Evaluation  Patient identified by MRN, date of birth, ID band Patient awake    Reviewed: Allergy & Precautions, NPO status , Patient's Chart, lab work & pertinent test results  Airway Mallampati: II  TM Distance: >3 FB Neck ROM: Full    Dental no notable dental hx.    Pulmonary neg pulmonary ROS,    Pulmonary exam normal breath sounds clear to auscultation       Cardiovascular negative cardio ROS Normal cardiovascular exam Rhythm:Regular Rate:Normal     Neuro/Psych  Headaches, PSYCHIATRIC DISORDERS Depression    GI/Hepatic negative GI ROS, Neg liver ROS,   Endo/Other  Morbid obesity (BMI 40)  Renal/GU negative Renal ROS  negative genitourinary   Musculoskeletal negative musculoskeletal ROS (+)   Abdominal   Peds  (+) ATTENTION DEFICIT DISORDER WITHOUT HYPERACTIVITY Hematology negative hematology ROS (+)   Anesthesia Other Findings IOL for post dates  Reproductive/Obstetrics (+) Pregnancy                             Anesthesia Physical Anesthesia Plan  ASA: III  Anesthesia Plan: Epidural   Post-op Pain Management:    Induction:   PONV Risk Score and Plan: Treatment may vary due to age or medical condition  Airway Management Planned: Natural Airway  Additional Equipment:   Intra-op Plan:   Post-operative Plan:   Informed Consent: I have reviewed the patients History and Physical, chart, labs and discussed the procedure including the risks, benefits and alternatives for the proposed anesthesia with the patient or authorized representative who has indicated his/her understanding and acceptance.       Plan Discussed with: Anesthesiologist  Anesthesia Plan Comments: (Patient identified. Risks, benefits, options discussed with patient including but not limited to bleeding, infection, nerve damage, paralysis, failed block, incomplete pain control, headache, blood  pressure changes, nausea, vomiting, reactions to medication, itching, and post partum back pain. Confirmed with bedside nurse the patient's most recent platelet count. Confirmed with the patient that they are not taking any anticoagulation, have any bleeding history or any family history of bleeding disorders. Patient expressed understanding and wishes to proceed. All questions were answered. )        Anesthesia Quick Evaluation

## 2021-04-16 NOTE — Progress Notes (Signed)
Lisa Ellison is a 30 y.o. G2P0010 at [redacted]w[redacted]d by ultrasound admitted for induction of labor due to Post dates. Due date 04/08/21.  Subjective: No c/o.  Just received Stadol and able to rest comfortably.  Objective: BP 117/73   Pulse 83   Temp 98.4 F (36.9 C) (Oral)   Resp 15   Ht 5\' 9"  (1.753 m)   Wt 122.9 kg   LMP 07/02/2020   BMI 40.02 kg/m  No intake/output data recorded. No intake/output data recorded.  FHT:  FHR: 125 bpm, variability: moderate,  accelerations:  Present,  decelerations:  Absent UC:   irregular, every 2-3 minutes SVE:   Dilation: 1 Effacement (%): 50 Station: -2 Exam by:: Dr. Lynnette Ellison Foley bulb in place when tugged  Labs: Lab Results  Component Value Date   WBC 15.1 (H) 04/16/2021   HGB 10.1 (L) 04/16/2021   HCT 33.3 (L) 04/16/2021   MCV 80.0 04/16/2021   PLT 285 04/16/2021    Assessment / Plan: Induction of labor due to postterm,  progressing well on pitocin and foley bulb  Labor: Progressing normally Preeclampsia:  n/a Fetal Wellbeing:  Category I Pain Control:  IV pain meds I/D:  n/a Anticipated MOD:  NSVD  Lisa Ellison 04/16/2021, 12:38 PM

## 2021-04-16 NOTE — Progress Notes (Signed)
Lisa Ellison is a 30 y.o. G2P0010 at [redacted]w[redacted]d by ultrasound admitted for induction of labor due to Post dates. Due date 04/08/21.  Subjective: No c/o.  Comfortable with CLEA.  Objective: BP 103/65   Pulse 83   Temp 97.6 F (36.4 C) (Axillary)   Resp 15   Ht 5\' 9"  (1.753 m)   Wt 122.9 kg   LMP 07/02/2020   BMI 40.02 kg/m  No intake/output data recorded. No intake/output data recorded.  FHT:  FHR: 125 bpm, variability: moderate,  accelerations:  Present,  decelerations:  Absent UC:   Difficult to trace SVE:   Dilation: 4 Effacement (%): 50 Station: -3 Exam by:: Keith Rake RN  IUPC placed   Labs: Lab Results  Component Value Date   WBC 15.1 (H) 04/16/2021   HGB 10.1 (L) 04/16/2021   HCT 33.3 (L) 04/16/2021   MCV 80.0 04/16/2021   PLT 285 04/16/2021    Assessment / Plan: Induction of labor due to postterm,  progressing well on pitocin  Labor: Latent labor; IUPC placed and will follow MVUs on pitocin Preeclampsia:  n/a Fetal Wellbeing:  Category I Pain Control:  Epidural I/D:  n/a Anticipated MOD:  NSVD  [name] 04/16/2021, 9:28 PM

## 2021-04-16 NOTE — H&P (Signed)
Lisa Ellison is a 30 y.o. female G2P0010 at [redacted]w[redacted]d presenting for post-dates IOL.  Patient received second dose of cytotec at 0620 and is feeling no CTX.  She reports active FM.  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 Chalmers Cater; euthyroid on no medication.  Biopsy results returned as Hurthle cell.  GBS negative.  Last u/s 3/15 with EFW 7#2 (87%).    OB History    Gravida  2   Para      Term      Preterm      AB  1   Living        SAB      IAB  1   Ectopic      Multiple      Live Births             Past Medical History:  Diagnosis Date  . ADD (attention deficit disorder)    Past Surgical History:  Procedure Laterality Date  . NO PAST SURGERIES    . WISDOM TOOTH EXTRACTION     Family History: family history includes Diabetes in her maternal grandmother; Hypertension in her father and mother; Stroke in her maternal grandfather. Social History:  reports that she has never smoked. She has never used smokeless tobacco. She reports current alcohol use. She reports that she does not use drugs.     Maternal Diabetes: No Genetic Screening: Normal Maternal Ultrasounds/Referrals: Normal Fetal Ultrasounds or other Referrals:  None Maternal Substance Abuse:  No Significant Maternal Medications:  None Significant Maternal Lab Results:  Group B Strep negative Other Comments:  None  Review of Systems Maternal Medical History:  Fetal activity: Perceived fetal activity is normal.   Last perceived fetal movement was within the past hour.    Prenatal complications: no prenatal complications Prenatal Complications - Diabetes: none.    Dilation: Closed Effacement (%): Thick Station: -3 Exam by:: [employer] RN Blood pressure 114/65, pulse 77, temperature 97.6 F (36.4 C), temperature source Oral, resp. rate 15, height 5\' 9"  (1.753 m), weight 122.9 kg, last menstrual period 07/02/2020. Maternal Exam:  Abdomen: Patient reports no  abdominal tenderness. Fundal height is c/w dates.   Estimated fetal weight is 8#.       Fetal Exam Fetal Monitor Review: Baseline rate: 125.  Variability: moderate (6-25 bpm).   Pattern: accelerations present and no decelerations.    Fetal State Assessment: Category I - tracings are normal.     Physical Exam Constitutional:      Appearance: Normal appearance.  HENT:     Head: Normocephalic and atraumatic.  Pulmonary:     Effort: Pulmonary effort is normal.  Abdominal:     Palpations: Abdomen is soft.  Musculoskeletal:        General: Normal range of motion.     Cervical back: Normal range of motion.  Skin:    General: Skin is warm and dry.  Neurological:     Mental Status: She is alert and oriented to person, place, and time.  Psychiatric:        Mood and Affect: Mood normal.        Behavior: Behavior normal.     Prenatal labs: ABO, Rh: --/--/O POS (04/18 0151) Antibody: NEG (04/18 0151) Rubella: Immune (11/02 0000) RPR: Nonreactive, Nonreactive (11/02 0000)  HBsAg: Negative (11/02 0000)  HIV: Non-reactive (11/02 0000)  GBS: Negative/-- (03/15 0000)   Assessment/Plan: 30yo G2P0010 at [redacted]w[redacted]d for post-dates IOL -Recheck cervix at 1020 -CLEA  if desired -Anticipate NSVD   [name] 04/16/2021, 8:32 AM

## 2021-04-17 ENCOUNTER — Encounter (HOSPITAL_COMMUNITY): Payer: Self-pay | Admitting: Obstetrics and Gynecology

## 2021-04-17 MED ORDER — ACETAMINOPHEN 325 MG PO TABS
650.0000 mg | ORAL_TABLET | ORAL | Status: DC | PRN
  Administered 2021-04-17 – 2021-04-18 (×2): 650 mg via ORAL
  Filled 2021-04-17: qty 2

## 2021-04-17 MED ORDER — OXYCODONE-ACETAMINOPHEN 5-325 MG PO TABS: 2.0000 | ORAL_TABLET | ORAL | Status: DC | PRN

## 2021-04-17 MED ORDER — ONDANSETRON HCL 4 MG/2ML IJ SOLN: 4.0000 mg | INTRAMUSCULAR | Status: DC | PRN

## 2021-04-17 MED ORDER — DIBUCAINE (PERIANAL) 1 % EX OINT: 1.0000 "application " | TOPICAL_OINTMENT | CUTANEOUS | Status: DC | PRN

## 2021-04-17 MED ORDER — SENNOSIDES-DOCUSATE SODIUM 8.6-50 MG PO TABS
2.0000 | ORAL_TABLET | Freq: Every day | ORAL | Status: DC
  Administered 2021-04-18 – 2021-04-19 (×2): 2 via ORAL
  Filled 2021-04-17 (×2): qty 2

## 2021-04-17 MED ORDER — WITCH HAZEL-GLYCERIN EX PADS
1.0000 "application " | MEDICATED_PAD | CUTANEOUS | Status: DC | PRN
  Administered 2021-04-17: 1 via TOPICAL

## 2021-04-17 MED ORDER — TETANUS-DIPHTH-ACELL PERTUSSIS 5-2.5-18.5 LF-MCG/0.5 IM SUSY: 0.5000 mL | PREFILLED_SYRINGE | Freq: Once | INTRAMUSCULAR | Status: DC

## 2021-04-17 MED ORDER — IBUPROFEN 600 MG PO TABS
600.0000 mg | ORAL_TABLET | Freq: Four times a day (QID) | ORAL | Status: DC
  Administered 2021-04-17 – 2021-04-19 (×9): 600 mg via ORAL
  Filled 2021-04-17 (×9): qty 1

## 2021-04-17 MED ORDER — SIMETHICONE 80 MG PO CHEW: 80.0000 mg | CHEWABLE_TABLET | ORAL | Status: DC | PRN

## 2021-04-17 MED ORDER — DIPHENHYDRAMINE HCL 25 MG PO CAPS: 25.0000 mg | ORAL_CAPSULE | Freq: Four times a day (QID) | ORAL | Status: DC | PRN

## 2021-04-17 MED ORDER — COCONUT OIL OIL
1.0000 "application " | TOPICAL_OIL | Status: DC | PRN
  Administered 2021-04-17: 1 via TOPICAL

## 2021-04-17 MED ORDER — PRENATAL MULTIVITAMIN CH
1.0000 | ORAL_TABLET | Freq: Every day | ORAL | Status: DC
  Administered 2021-04-17 – 2021-04-19 (×3): 1 via ORAL
  Filled 2021-04-17 (×3): qty 1

## 2021-04-17 MED ORDER — ONDANSETRON HCL 4 MG PO TABS: 4.0000 mg | ORAL_TABLET | ORAL | Status: DC | PRN

## 2021-04-17 MED ORDER — ZOLPIDEM TARTRATE 5 MG PO TABS: 5.0000 mg | ORAL_TABLET | Freq: Every evening | ORAL | Status: DC | PRN

## 2021-04-17 MED ORDER — BENZOCAINE-MENTHOL 20-0.5 % EX AERO
1.0000 "application " | INHALATION_SPRAY | CUTANEOUS | Status: DC | PRN
  Administered 2021-04-17: 1 via TOPICAL
  Filled 2021-04-17: qty 56

## 2021-04-17 MED ORDER — OXYCODONE-ACETAMINOPHEN 5-325 MG PO TABS: 1.0000 | ORAL_TABLET | ORAL | Status: DC | PRN

## 2021-04-17 NOTE — Lactation Note (Signed)
This note was copied from a baby's chart. Lactation Consultation Note  Patient Name: Lisa Ellison [id number]'Y Date: 04/17/2021   Age:28 hours  Baby Lisa [place]wood now 22 hours old.  Mom reports she has tried to feed him off and on a few times.  Doesn't do much.  Urged to feed on cue and offer the breast 8-12 or more times day.  Discussed hand expression and spoon feeding. Reviewed normal newborn behavior first 24 hours.  Mom reports she was taught how to hand express. . Praised breastfeeding  Urged mom to call lactation as needed.   Maternal Data    Feeding    LATCH Score Latch: Grasps breast easily, tongue down, lips flanged, rhythmical sucking.  Audible Swallowing: A few with stimulation  Type of Nipple: Everted at rest and after stimulation  Comfort (Breast/Nipple): Soft / non-tender  Hold (Positioning): Assistance needed to correctly position infant at breast and maintain latch.  LATCH Score: 8   Lactation Tools Discussed/Used    Interventions Interventions: Breast feeding basics reviewed;Assisted with latch;Skin to skin;Hand express;Adjust position;Breast compression  Discharge    Consult Status      Huma Imhoff Thompson Caul 04/17/2021, 1:42 PM

## 2021-04-17 NOTE — Progress Notes (Signed)
Chaplain responded to neonatal code blue page.  Upon arrival to the floor the RN indicated the code team had left and no need for spiritual support at this time.  Chaplain let them know we are available should things change.  Chaplain will pass on to day WC Chaplain to follow up. [place], [place].     04/17/21 0737  Clinical Encounter Type  Visited With Health care provider  Visit Type Code  Referral From Nurse  Consult/Referral To Chaplain

## 2021-04-17 NOTE — Progress Notes (Signed)
Patient now feeling urge to push.   FHT Cat I Will begin second stage.  [name], DO

## 2021-04-17 NOTE — Consult Note (Signed)
Neonatology Note:  Attendance at Code Apgar:  Our team responded to a Neonatal Code Blue call to room # 215 following NSVD.  By the time our team had arrived, infant was crying, and we were turned away at the door.   Please do not hesitate in contacting us if further concerns.   Towana Badger, MD, MS

## 2021-04-17 NOTE — Progress Notes (Signed)
Lisa Ellison is a 30 y.o. G2P0010 at [redacted]w[redacted]d by ultrasound admitted for induction of labor due to Post dates. Due date 04/08/21.  Subjective: Very comfortable with CLEA.  Objective: BP 109/75   Pulse 88   Temp 98.3 F (36.8 C) (Axillary)   Resp 16   Ht 5\' 9"  (1.753 m)   Wt 122.9 kg   LMP 07/02/2020   BMI 40.02 kg/m  No intake/output data recorded. Total I/O In: -  Out: 900 [Urine:900]  FHT:  FHR: 145 bpm, variability: moderate,  accelerations:  Abscent,  decelerations:  Present variables with pushing UC:   regular, every 2 minutes SVE:   Dilation: 10 Effacement (%): 100 Station: Plus 1 Exam by:: Keith Rake RN  Labs: Lab Results  Component Value Date   WBC 15.1 (H) 04/16/2021   HGB 10.1 (L) 04/16/2021   HCT 33.3 (L) 04/16/2021   MCV 80.0 04/16/2021   PLT 285 04/16/2021    Assessment / Plan: Induction of labor for post-dates  Labor: Patient has been pushing x 30 minutes but given no maternal sensation/pressure with CLEA and +1 station, will allow patient to labor down.  Preeclampsia:  n/a Fetal Wellbeing:  Category 2 wtih variables with pushing.  Category I when discontinued pushing Pain Control:  Epidural I/D:  n/a Anticipated MOD:  NSVD  [name] 04/17/2021, 4:36 AM

## 2021-04-17 NOTE — Lactation Note (Signed)
This note was copied from a baby's chart. Lactation Consultation Note  Patient Name: Lisa Ellison [id number] Date: 04/17/2021 Reason for consult: L&D Initial assessment Age:28 hours   Assisted with latching.  Baby cueing.  Demonstrated to mother good flow of colostrum. Baby latched off and on with teacup hold.  Will follow up on MBU.   Maternal Data Does the patient have breastfeeding experience prior to this delivery?: No  Feeding Mother's Current Feeding Choice: Breast Milk  LATCH Score Latch: Repeated attempts needed to sustain latch, nipple held in mouth throughout feeding, stimulation needed to elicit sucking reflex.  Audible Swallowing: A few with stimulation  Type of Nipple: Flat  Comfort (Breast/Nipple): Soft / non-tender  Hold (Positioning): Assistance needed to correctly position infant at breast and maintain latch.  LATCH Score: 6   Lactation Tools Discussed/Used    Interventions Interventions: Breast feeding basics reviewed;Assisted with latch;Skin to skin;Education  Discharge    Consult Status Consult Status: Follow-up Date: 04/17/21 Follow-up type: In-patient    Vivianne Master [facility] 04/17/2021, 9:04 AM

## 2021-04-18 LAB — CBC
HCT: 25.3 % — ABNORMAL LOW (ref 36.0–46.0)
Hemoglobin: 7.6 g/dL — ABNORMAL LOW (ref 12.0–15.0)
MCH: 24 pg — ABNORMAL LOW (ref 26.0–34.0)
MCHC: 30 g/dL (ref 30.0–36.0)
MCV: 79.8 fL — ABNORMAL LOW (ref 80.0–100.0)
Platelets: 216 10*3/uL (ref 150–400)
RBC: 3.17 MIL/uL — ABNORMAL LOW (ref 3.87–5.11)
RDW: 14.8 % (ref 11.5–15.5)
WBC: 15.5 10*3/uL — ABNORMAL HIGH (ref 4.0–10.5)
nRBC: 0 % (ref 0.0–0.2)

## 2021-04-18 LAB — BIRTH TISSUE RECOVERY COLLECTION (PLACENTA DONATION)

## 2021-04-18 NOTE — Progress Notes (Signed)
PPD # 1  Doing well No complaints.  BP 117/69 (BP Location: Left Arm)   Pulse 89   Temp 98.3 F (36.8 C) (Oral)   Resp 18   Ht 5\' 9"  (1.753 m)   Wt 122.9 kg   LMP 07/02/2020   SpO2 100%   Breastfeeding Unknown   BMI 40.02 kg/m  Results for orders placed or performed during the hospital encounter of 04/16/21 (from the past 24 hour(s))  CBC     Status: Abnormal   Collection Time: 04/18/21  5:56 AM  Result Value Ref Range   WBC 15.5 (H) 4.0 - 10.5 K/uL   RBC 3.17 (L) 3.87 - 5.11 MIL/uL   Hemoglobin 7.6 (L) 12.0 - 15.0 g/dL   HCT 25.3 (L) 36.0 - 46.0 %   MCV 79.8 (L) 80.0 - 100.0 fL   MCH 24.0 (L) 26.0 - 34.0 pg   MCHC 30.0 30.0 - 36.0 g/dL   RDW 14.8 11.5 - 15.5 %   Platelets 216 150 - 400 K/uL   nRBC 0.0 0.0 - 0.2 %   Abdomen is soft and non tender Lochia wnl  PPD # 1  Doing well Routine care  circ today - discussed with patient Discharge home tomorrow

## 2021-04-18 NOTE — Anesthesia Postprocedure Evaluation (Signed)
Anesthesia Post Note  Patient: Lisa Ellison  Procedure(s) Performed: AN AD HOC LABOR EPIDURAL     Patient location during evaluation: Mother Baby Anesthesia Type: Epidural Level of consciousness: awake, awake and alert and oriented Pain management: pain level controlled Vital Signs Assessment: post-procedure vital signs reviewed and stable Respiratory status: spontaneous breathing and respiratory function stable Cardiovascular status: blood pressure returned to baseline Postop Assessment: no headache, epidural receding, patient able to bend at knees, adequate PO intake, no backache, no apparent nausea or vomiting and able to ambulate Anesthetic complications: no   No complications documented.  Last Vitals:  Vitals:   04/17/21 2231 04/18/21 0628  BP: 130/76 117/69  Pulse: 88 89  Resp: 16 18  Temp: 37.2 C 36.8 C  SpO2: 100% 100%    Last Pain:  Vitals:   04/18/21 0800  TempSrc:   PainSc: 0-No pain   Pain Goal:                Epidural/Spinal Function Cutaneous sensation: Normal sensation (04/18/21 0800), Patient able to flex knees: Yes (04/18/21 0800), Patient able to lift hips off bed: Yes (04/18/21 0800), Back pain beyond tenderness at insertion site: No (04/18/21 0800), Progressively worsening motor and/or sensory loss: No (04/18/21 0800), Bowel and/or bladder incontinence post epidural: No (04/18/21 0800)  Bufford Spikes

## 2021-04-18 NOTE — Lactation Note (Signed)
This note was copied from a baby's chart. Lactation Consultation Note  Patient Name: Lisa Ellison [id number] Date: 04/18/2021 Reason for consult: Follow-up assessment;Primapara;Term Age:28 hours   [facility] Student Loucile Posner arrived in the room where MOB and FOB are present. MOB is concerned about infant being sleep since his circumcision at 9:03am. MOB stated she tried to wake him up twice but did not succeed. [place] Student informed MOB that it is normal for infants to want to sleep after circumcision. [place] Student advised MOB to unswaddle the infant and remove his clothing in order to attempt to wake him up. MOB stated she would try that next time. [facility] Student reviewed the importance of breast stimulation, information about pumping, the benefits of skin to skin, infant feeding patterns and stomach size, proper latch, and cluster feeding. MOB reported she was taught hand expression by the nurse and she feels comfortable doing it. MOB stated she understood and did not have any further questions. MOB stated she does not receive WIC and she is getting a pump through [place] Student advised her to call her insurance company because they may be able to cover the cost of her getting a pump. MOB was informed about outpatient services and provided with brochure and told if she needed further assistance to give lactation a call.         MOB plans to breastfeed and then bottle feed when she goes back to work. [place] Student advised her to continue to breastfeed 8-12x in 24 hours, follow infant feeding cues, and feed infant skin to skin.                                                                          Maternal Data Has patient been taught Hand Expression?: Yes Does the patient have breastfeeding experience prior to this delivery?: No  Feeding Mother's Current Feeding Choice: Breast Milk  Lactation Tools Discussed/Used Pump  Interventions Interventions: Breast feeding basics reviewed  Discharge Pump:  Personal WIC Program: No  Consult Status Date: 04/19/21    Farid Grigorian Tamala Julian 04/18/2021, 2:18 PM

## 2021-04-19 MED ORDER — ACETAMINOPHEN 325 MG PO TABS: 650.0000 mg | ORAL_TABLET | Freq: Four times a day (QID) | ORAL | 0 refills | Status: DC | PRN

## 2021-04-19 MED ORDER — IBUPROFEN 600 MG PO TABS: 600.0000 mg | ORAL_TABLET | Freq: Four times a day (QID) | ORAL | 0 refills | Status: DC | PRN

## 2021-04-19 MED ORDER — FERROUS SULFATE 325 (65 FE) MG PO TBEC: 325.0000 mg | DELAYED_RELEASE_TABLET | Freq: Every day | ORAL | 0 refills | Status: DC

## 2021-04-19 NOTE — Lactation Note (Signed)
This note was copied from a baby's chart. Lactation Consultation Note  Patient Name: Boy Tamyrah Burbage [id number] Date: 04/19/2021 Reason for consult: Follow-up assessment;Term Age:28 hours  P1 mother whose infant is now 8 hours old.  This is a term baby at 41+2 weeks.  Mother was trying to rest when I arrived.  She had no questions/concerns related to breast feeding.  She feels like baby has been latching well.  LATCH scores of 9 with adequate voiding/stooling.  Reviewed engorgement prevention/treatment and provided a manual pump with instructions for use.  #24 flange size is appropriate.  Father present and supportive.  Baby has been discharged; family awaiting an OB discharge.  Mother has our OP phone number for any further questions/concerns.   Maternal Data Has patient been taught Hand Expression?: Yes Does the patient have breastfeeding experience prior to this delivery?: No  Feeding Mother's Current Feeding Choice: Breast Milk  LATCH Score                    Lactation Tools Discussed/Used Tools: Pump  Interventions Interventions: Education  Discharge Discharge Education: Engorgement and breast care Pump: Manual;DEBP;Personal  Consult Status Consult Status: Complete Date: 04/19/21 Follow-up type: Call as needed    Cherise Fedder R Misaki Sozio 04/19/2021, 10:32 AM

## 2021-04-19 NOTE — Discharge Summary (Signed)
Postpartum Discharge Summary  Date of Service updated4/21/22     Patient Name: Lisa Ellison DOB: 12-Feb-1991 MRN: [id number]  Date of admission: 04/16/2021 Delivery date:04/17/2021  Delivering provider: [name]  Date of discharge: 04/19/2021  Admitting diagnosis: Pregnancy [Z34.90] Intrauterine pregnancy: [redacted]w[redacted]d     Secondary diagnosis:  Active Problems:   Pregnancy  Additional problems:     Discharge diagnosis: Term Pregnancy Delivered                                              Post partum procedures: Augmentation: AROM and Pitocin Complications: None  Hospital course: Induction of Labor With Vaginal Delivery   [name] at [redacted]w[redacted]d was admitted to the hospital 04/16/2021 for induction of labor.  Indication for induction: Postdates.  Patient had an uncomplicated labor course as follows: Membrane Rupture Time/Date: 4:53 PM ,04/16/2021   Delivery Method:Vaginal, Spontaneous  Episiotomy: None  Lacerations:  2nd degree  Details of delivery can be found in separate delivery note.  Patient had a routine postpartum course. Patient is discharged home 04/19/21.  Newborn Data: Birth date:04/17/2021  Birth time:7:17 AM  Gender:Female  Living status:Living  Apgars:4 ,8  Weight:4196 g   Magnesium Sulfate received: No BMZ received: No Rhophylac:No MMR:No T-DaP:Given prenatally Flu: No Transfusion:No  Physical exam  Vitals:   04/18/21 0628 04/18/21 1226 04/18/21 2137 04/19/21 0515  BP: 117/69 125/72 123/73 124/76  Pulse: 89 96 100 87  Resp: $Remo'18 18 16 18  'HODIX$ Temp: 98.3 F (36.8 C) 97.7 F (36.5 C) 98 F (36.7 C) 98.7 F (37.1 C)  TempSrc: Oral Axillary Oral Oral  SpO2: 100% 100% 98% 98%  Weight:      Height:       General: alert, cooperative and no distress Lochia: appropriate Uterine Fundus: firm Incision: Healing well with no significant drainage DVT Evaluation: No evidence of DVT seen on physical exam. Labs: Lab Results  Component Value Date   WBC 15.5  (H) 04/18/2021   HGB 7.6 (L) 04/18/2021   HCT 25.3 (L) 04/18/2021   MCV 79.8 (L) 04/18/2021   PLT 216 04/18/2021   CMP Latest Ref Rng & Units 04/16/2021  Glucose 70 - 99 mg/dL 111(H)  BUN 6 - 20 mg/dL 9  Creatinine 0.44 - 1.00 mg/dL 0.72  Sodium 135 - 145 mmol/L 135  Potassium 3.5 - 5.1 mmol/L 4.1  Chloride 98 - 111 mmol/L 104  CO2 22 - 32 mmol/L 22  Calcium 8.9 - 10.3 mg/dL 9.1  Total Protein 6.5 - 8.1 g/dL 5.9(L)  Total Bilirubin 0.3 - 1.2 mg/dL 0.3  Alkaline Phos 38 - 126 U/L 168(H)  AST 15 - 41 U/L 30  ALT 0 - 44 U/L 18   Edinburgh Score: No flowsheet data found.    After visit meds:  Allergies as of 04/19/2021      Reactions   Amoxicillin Hives      Medication List    STOP taking these medications   famotidine 20 MG tablet Commonly known as: PEPCID     TAKE these medications   acetaminophen 325 MG tablet Commonly known as: Tylenol Take 2 tablets (650 mg total) by mouth every 6 (six) hours as needed (for pain scale < 4).   ferrous sulfate 325 (65 FE) MG EC tablet Take 1 tablet (325 mg total) by mouth daily with breakfast.  ibuprofen 600 MG tablet Commonly known as: ADVIL Take 1 tablet (600 mg total) by mouth every 6 (six) hours as needed.   prenatal multivitamin Tabs tablet Take 1 tablet by mouth daily at 12 noon.        Discharge home in stable condition Infant Feeding: Breast Infant Disposition:home with mother Discharge instruction: per After Visit Summary and Postpartum booklet. Activity: Advance as tolerated. Pelvic rest for 6 weeks.  Diet: routine diet Anticipated Birth Control: Unsure Postpartum Appointment:6 weeks Additional Postpartum F/U: Postpartum Depression checkup Future Appointments:No future appointments. Follow up Visit:      04/19/2021 Lisa Katz, MD

## 2021-07-03 ENCOUNTER — Encounter: Payer: Self-pay | Admitting: Family Medicine

## 2021-07-03 ENCOUNTER — Ambulatory Visit (INDEPENDENT_AMBULATORY_CARE_PROVIDER_SITE_OTHER): Payer: Managed Care, Other (non HMO) | Admitting: Family Medicine

## 2021-07-03 ENCOUNTER — Other Ambulatory Visit: Payer: Self-pay

## 2021-07-03 VITALS — BP 120/84 | HR 94 | Temp 98.7°F | Ht 68.0 in | Wt 237.2 lb

## 2021-07-03 DIAGNOSIS — F988 Other specified behavioral and emotional disorders with onset usually occurring in childhood and adolescence: Secondary | ICD-10-CM

## 2021-07-03 MED ORDER — AMPHETAMINE-DEXTROAMPHETAMINE 20 MG PO TABS: 20.0000 mg | ORAL_TABLET | Freq: Two times a day (BID) | ORAL | 0 refills | Status: DC

## 2021-07-03 NOTE — Assessment & Plan Note (Signed)
Restart Adderall 20 mg BID for ADD.  Follow up for reassessment back on medication in 1 month, than space to q3 month follow ups.

## 2021-07-03 NOTE — Patient Instructions (Signed)
Restart Adderall 20 mg BID for ADD.

## 2021-07-03 NOTE — Progress Notes (Signed)
ADD  She has been off Adderall in the last  year.. was off while she was pregnant.  She had noted issues with concentration, cloudy thinking, trouble focus. Trouble getting tasks done. She denies anxiety or depression. Compliant with meds: benefit from med (ie increase in concentration): change in mood: change in appetite:none Insomnia:  child  sleeps 5-7 hours... she has been home with newborn child.  Going back to work 07/10/21 She is not breast feeding after that date. tremor:none compliant with behavioral modification:yes  Flowsheet Row Office Visit from 07/03/2021 in [place] [facility] Total Score 0      BP Readings from Last 3 Encounters:  07/03/21 120/84  04/19/21 124/76  08/22/20 100/60     PMH and SH reviewed  ROS: Per HPI unless specifically indicated in ROS section   Meds, vitals, and allergies reviewed.   GEN: nad, alert and oriented, affect wnl and appropriate HEENT: mucous membranes moist NECK: supple w/o LA CV: rrr.  PULM: ctab, no inc wob ABD: soft, +bs EXT: no edema CN 2-12 wnl, s/s/dtr wnl x4.  No tremor.  Problem List Items Addressed This Visit     Attention deficit disorder (ADD) without hyperactivity - Primary     Restart Adderall 20 mg BID for ADD.  Follow up for reassessment back on medication in 1 month, than space to q3 month follow ups.       Meds ordered this encounter  Medications   amphetamine-dextroamphetamine (ADDERALL) 20 MG tablet    Sig: Take 1 tablet (20 mg total) by mouth 2 (two) times daily.    Dispense:  60 tablet    Refill:  0

## 2021-07-11 ENCOUNTER — Encounter: Payer: Self-pay | Admitting: Family Medicine

## 2021-08-07 ENCOUNTER — Ambulatory Visit (INDEPENDENT_AMBULATORY_CARE_PROVIDER_SITE_OTHER): Payer: Managed Care, Other (non HMO) | Admitting: Family Medicine

## 2021-08-07 ENCOUNTER — Other Ambulatory Visit: Payer: Self-pay

## 2021-08-07 ENCOUNTER — Encounter: Payer: Self-pay | Admitting: Family Medicine

## 2021-08-07 VITALS — BP 106/80 | HR 103 | Temp 98.1°F | Ht 68.0 in | Wt 240.0 lb

## 2021-08-07 DIAGNOSIS — F988 Other specified behavioral and emotional disorders with onset usually occurring in childhood and adolescence: Secondary | ICD-10-CM

## 2021-08-07 MED ORDER — AMPHETAMINE-DEXTROAMPHET ER 20 MG PO CP24: 20.0000 mg | ORAL_CAPSULE | ORAL | 0 refills | Status: DC

## 2021-08-07 MED ORDER — AMPHETAMINE-DEXTROAMPHETAMINE 20 MG PO TABS: 20.0000 mg | ORAL_TABLET | Freq: Every day | ORAL | 0 refills | Status: DC

## 2021-08-07 NOTE — Progress Notes (Signed)
ADD  At OV 1 month ago  she was started back on Adderall  20 mg BID  ( 8 AM and 2PM)after stopping for pregnancy.  Compliant with meds:Yes benefit from med (ie increase in concentration): She is able to concentrate  much more. She starts having issues again at & PM at night. change in mood: none change in appetite: none Insomnia: none tremor:none compliant with behavioral modification:   BP Readings from Last 3 Encounters:  08/07/21 106/80  07/03/21 120/84  04/19/21 124/76   Wt Readings from Last 3 Encounters:  08/07/21 240 lb (108.9 kg)  07/03/21 237 lb 4 oz (107.6 kg)  04/16/21 271 lb (122.9 kg)    PMH and SH reviewed  ROS: Per HPI unless specifically indicated in ROS section   Meds, vitals, and allergies reviewed.   GEN: nad, alert and oriented, affect wnl and appropriate HEENT: mucous membranes moist NECK: supple w/o LA CV: rrr.  PULM: ctab, no inc wob ABD: soft, +bs EXT: no edema CN 2-12 wnl, s/s/dtr wnl x4.  No tremor.   Problem List Items Addressed This Visit     Attention deficit disorder (ADD) without hyperactivity - Primary    Change regimen to Extended Relase Adderall in AM and try to move Adderall short acting to take later in day about 3-4 PM. Make sure not interfering with sleep at night.

## 2021-08-07 NOTE — Assessment & Plan Note (Signed)
Change regimen to Extended Relase Adderall in AM and try to move Adderall short acting to take later in day about 3-4 PM. Make sure not interfering with sleep at night.

## 2021-08-07 NOTE — Patient Instructions (Addendum)
Can try compression sock.. 15-30 mm Hg compression.  Change regimen to Extended Relase Adderall in AM and try to move Adderall short acting to take later in day about 3-4 PM. Make sure not interfering with sleep at night.

## 2021-09-11 ENCOUNTER — Encounter: Payer: Self-pay | Admitting: Family Medicine

## 2021-09-11 ENCOUNTER — Ambulatory Visit: Payer: Managed Care, Other (non HMO) | Admitting: Family Medicine

## 2021-09-11 ENCOUNTER — Other Ambulatory Visit: Payer: Self-pay

## 2021-09-11 ENCOUNTER — Telehealth (INDEPENDENT_AMBULATORY_CARE_PROVIDER_SITE_OTHER): Payer: Managed Care, Other (non HMO) | Admitting: Family Medicine

## 2021-09-11 VITALS — Temp 97.4°F | Ht 68.0 in

## 2021-09-11 DIAGNOSIS — F988 Other specified behavioral and emotional disorders with onset usually occurring in childhood and adolescence: Secondary | ICD-10-CM

## 2021-09-11 MED ORDER — AMPHETAMINE-DEXTROAMPHET ER 20 MG PO CP24: 20.0000 mg | ORAL_CAPSULE | ORAL | 0 refills | Status: DC

## 2021-09-11 MED ORDER — AMPHETAMINE-DEXTROAMPHETAMINE 20 MG PO TABS: 20.0000 mg | ORAL_TABLET | Freq: Every day | ORAL | 0 refills | Status: DC

## 2021-09-11 NOTE — Assessment & Plan Note (Signed)
Chronic, improved control on new regimen withouit SE.  Continue: Extended release Adderall in AM and try to move Adderall short acting to take later in day about 3-4 PM.

## 2021-09-11 NOTE — Progress Notes (Signed)
VIRTUAL VISIT Due to national recommendations of social distancing due to [place] 19, a virtual visit is felt to be most appropriate for this patient at this time.   I connected with the patient on 09/11/21 at 11:40 AM EDT by virtual telehealth platform and verified that I am speaking with the correct person using two identifiers.   I discussed the limitations, risks, security and privacy concerns of performing an evaluation and management service by  virtual telehealth platform and the availability of in person appointments. I also discussed with the patient that there may be a patient responsible charge related to this service. The patient expressed understanding and agreed to proceed.  Patient location: Home Provider Location: [facility] [place] Participants: Eliezer Lofts and Narda Amber [facility]   Chief Complaint  Patient presents with   Follow-up    ADD    History of Present Illness: ADD  At last OV  we changed her regimen  to extended release Adderall in AM and try to move Adderall short acting to take later in day about 3-4 PM. Compliant with meds: She reports she has noted better control of mood.Marland Kitchen extended release seems to last longer, only occ using later in day rapid acting dose.  benefit from med (ie increase in concentration): improvement in concentration and ability to do tasks. change in mood: none change in appetite: none Insomnia:none tremor: none compliant with behavioral modification: yes  PMH and SH reviewed   COVID 19 screen No recent travel or known exposure to [place] The patient denies respiratory symptoms of COVID 19 at this time.  The importance of social distancing was discussed today.   Review of Systems  Constitutional:  Negative for chills, fever and weight loss.  Respiratory:  Negative for shortness of breath.   Cardiovascular:  Negative for chest pain and palpitations.  Gastrointestinal:  Negative for abdominal pain, blood in stool,  constipation, diarrhea, nausea and vomiting.  Neurological:  Negative for dizziness.  Psychiatric/Behavioral:  Negative for depression. The patient is not nervous/anxious.     Past Medical History:  Diagnosis Date   ADD (attention deficit disorder)     reports that she has never smoked. She has never used smokeless tobacco. She reports current alcohol use. She reports that she does not use drugs.   Current Outpatient Medications:    amphetamine-dextroamphetamine (ADDERALL XR) 20 MG 24 hr capsule, Take 1 capsule (20 mg total) by mouth every morning., Disp: 30 capsule, Rfl: 0   amphetamine-dextroamphetamine (ADDERALL) 20 MG tablet, Take 1 tablet (20 mg total) by mouth daily. Take  later in day around 3-4 PM., Disp: 30 tablet, Rfl: 0   ferrous sulfate 325 (65 FE) MG EC tablet, Take 1 tablet (325 mg total) by mouth daily with breakfast., Disp: 90 tablet, Rfl: 0   Prenatal Vit-Fe Fumarate-FA (PRENATAL MULTIVITAMIN) TABS tablet, Take 1 tablet by mouth daily at 12 noon., Disp: , Rfl:    Observations/Objective: Temperature (!) 97.4 F (36.3 C), temperature source Temporal, height '5\' 8"'$  (1.727 m), last menstrual period 08/14/2021, not currently breastfeeding.  Physical Exam  Physical Exam Constitutional:      General: The patient is not in acute distress. Pulmonary:     Effort: Pulmonary effort is normal. No respiratory distress.  Neurological:     Mental Status: The patient is alert and oriented to person, place, and time.  Psychiatric:        Mood and Affect: Mood normal.        Behavior: Behavior normal.  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  Continue: Extended release Adderall in AM and try to move Adderall short acting to take later in day about 3-4 PM.       I discussed the assessment and treatment plan with the  patient. The patient was provided an opportunity to ask questions and all were answered. The patient agreed with the plan and demonstrated an understanding of the instructions.   The patient was advised to call back or seek an in-person evaluation if the symptoms worsen or if the condition fails to improve as anticipated.     Eliezer Lofts, MD

## 2021-12-25 ENCOUNTER — Other Ambulatory Visit: Payer: Self-pay | Admitting: Family Medicine

## 2021-12-25 MED ORDER — AMPHETAMINE-DEXTROAMPHET ER 20 MG PO CP24: 20.0000 mg | ORAL_CAPSULE | ORAL | 0 refills | Status: DC

## 2021-12-25 MED ORDER — AMPHETAMINE-DEXTROAMPHETAMINE 20 MG PO TABS: 20.0000 mg | ORAL_TABLET | Freq: Every day | ORAL | 0 refills | Status: DC

## 2021-12-25 NOTE — Telephone Encounter (Signed)
Please schedule follow up ADD appointment with Dr. Diona Browner in 3 months.

## 2021-12-25 NOTE — Telephone Encounter (Signed)
Have patient make appt in 3 month for 6 month ADD eval.

## 2021-12-25 NOTE — Telephone Encounter (Signed)
Last office visit 09/11/2021 (virtual) for ADD.  Last refilled 09/11/2021 for #30 with no refills on each X 3 Months.  No future appointments.

## 2021-12-25 NOTE — Telephone Encounter (Signed)
See note below

## 2021-12-25 NOTE — Telephone Encounter (Signed)
1.Medication Requested: amphetamine-dextroamphetamine (ADDERALL XR) 20 MG 24 hr capsule amphetamine-dextroamphetamine (ADDERALL) 20 MG tablet  2. Pharmacy (Name, Street, [facility]):  [id number] - [place], [place] - [address] Phone:  [phone]  Fax:  [phone]      3. On Med List: Y  4. Last Visit with PCP: 9.13.22  5. Next visit date with PCP: not scheduled   Patient is out of medication

## 2021-12-26 NOTE — Telephone Encounter (Signed)
Sent a my chart letter could not lvm it was full

## 2021-12-27 NOTE — Telephone Encounter (Signed)
2nd attempt Unable to lm to schedule   

## 2022-01-25 ENCOUNTER — Telehealth: Payer: Self-pay | Admitting: Family Medicine

## 2022-01-25 MED ORDER — AMPHETAMINE-DEXTROAMPHET ER 20 MG PO CP24: 20.0000 mg | ORAL_CAPSULE | ORAL | 0 refills | Status: DC

## 2022-01-25 NOTE — Telephone Encounter (Signed)
Sent to dr. Diona Browner in separate message.

## 2022-01-25 NOTE — Telephone Encounter (Signed)
°  Encourage patient to contact the pharmacy for refills or they can request refills through [place]:  Please schedule appointment if longer than 1 year  NEXT APPOINTMENT DATE:  MEDICATION:amphetamine-dextroamphetamine (ADDERALL XR) 20 MG 24 hr capsule  Is the patient out of medication?   [place] [id number]  Let patient know to contact pharmacy at the end of the day to make sure medication is ready.  Please notify patient to allow 48-72 hours to process  CLINICAL FILLS OUT ALL BELOW:   LAST REFILL:  QTY:  REFILL DATE:    OTHER COMMENTS:    Okay for refill?  Please advise

## 2022-01-25 NOTE — Telephone Encounter (Signed)
Pt has f/u appt on 02/26/22

## 2022-01-25 NOTE — Telephone Encounter (Signed)
Please enter for refill.. make sure pt has scheduled follow up as requested below.

## 2022-01-25 NOTE — Telephone Encounter (Signed)
Pt called requesting refill for RX amphetamine-dextroamphetamine (ADDERALL) 20 MG tablet . [facility] [phone] has her medication and would like it to be sent there . Please Advise [phone]

## 2022-01-28 ENCOUNTER — Encounter: Payer: Self-pay | Admitting: Family Medicine

## 2022-01-28 ENCOUNTER — Telehealth: Payer: Self-pay | Admitting: Family Medicine

## 2022-01-28 NOTE — Telephone Encounter (Signed)
Lisa Ellison has available refills at her pharmacy for 01/25/2022 and 02/25/2022.  I left message for Lisa Ellison to check with her pharmacy because they should have refills on file for her.  I ask that she call me back if she has any problems getting her prescriptions filled.

## 2022-01-28 NOTE — Telephone Encounter (Signed)
°  Encourage patient to contact the pharmacy for refills or they can request refills through [place]:  Please schedule appointment if longer than 1 year  NEXT APPOINTMENT DATE:  MEDICATION:amphetamine-dextroamphetamine (ADDERALL) 20 MG tablet  Is the patient out of medication?   PHARMACY:[facility] [place], [place] [address] AT [facility] [address]  Let patient know to contact pharmacy at the end of the day to make sure medication is ready.  Please notify patient to allow 48-72 hours to process  CLINICAL FILLS OUT ALL BELOW:   LAST REFILL:  QTY:  REFILL DATE:    OTHER COMMENTS:    Okay for refill?  Please advise

## 2022-01-29 MED ORDER — AMPHETAMINE-DEXTROAMPHETAMINE 20 MG PO TABS: 20.0000 mg | ORAL_TABLET | Freq: Every day | ORAL | 0 refills | Status: DC

## 2022-01-29 NOTE — Telephone Encounter (Signed)
Adderall XR was sent to [facility] street but not the IR per patient. [facility] pharmacy on [address] does but not the [facility].

## 2022-02-14 ENCOUNTER — Encounter: Payer: Self-pay | Admitting: Emergency Medicine

## 2022-02-14 ENCOUNTER — Emergency Department
Admission: EM | Admit: 2022-02-14 | Discharge: 2022-02-15 | Disposition: A | Discharge status: Home / Self Care | Payer: Managed Care, Other (non HMO) | Attending: Emergency Medicine | Admitting: Emergency Medicine

## 2022-02-14 DIAGNOSIS — W260XXA Contact with knife, initial encounter: Secondary | ICD-10-CM | POA: Insufficient documentation

## 2022-02-14 DIAGNOSIS — S61213A Laceration without foreign body of left middle finger without damage to nail, initial encounter: Secondary | ICD-10-CM | POA: Insufficient documentation

## 2022-02-14 DIAGNOSIS — S6992XA Unspecified injury of left wrist, hand and finger(s), initial encounter: Secondary | ICD-10-CM | POA: Diagnosis present

## 2022-02-14 MED ORDER — LIDOCAINE HCL (PF) 1 % IJ SOLN
5.0000 mL | Freq: Once | INTRAMUSCULAR | Status: AC
  Administered 2022-02-14: 5 mL
  Filled 2022-02-14: qty 5

## 2022-02-14 NOTE — ED Triage Notes (Signed)
Pt presents via PO following a laceration to the base of her left middle finger. The patient has the finger wrapped with a shirt to control the bleeding. She states she was cutting an avocado and the knife slipped and cut her hand. The site was assessed - no bleeding at this time; dressed with guaze and tape. Denies CP or SOB.

## 2022-02-14 NOTE — Discharge Instructions (Addendum)
-  Return to urgent care or emergency department for suture removal approximately 10 days. -Return to the emergency department anytime if you begin to experience any new or worsening symptoms.

## 2022-02-14 NOTE — ED Provider Notes (Signed)
[facility] Provider Note    None    (approximate)   History   Chief Complaint Extremity Laceration   HPI Lisa Ellison is a 31 y.o. female, history of ADD and depression, presents to the emergency department for evaluation of laceration.  Patient states that she was cutting an avocado when the knife slipped and cut her left middle finger.  Patient applied pressure to the wound with her shirt, which allowed for control of the bleeding.  Denies fever/chills, numbness in hand, loss of motor function, wrist/forearm injury, or damage to nail beds  History Limitations: No limitations      Physical Exam  Triage Vital Signs: ED Triage Vitals  Enc Vitals Group     BP 02/14/22 2159 (!) 153/138     Pulse Rate 02/14/22 2159 (!) 116     Resp 02/14/22 2159 16     Temp 02/14/22 2159 98.9 F (37.2 C)     Temp Source 02/14/22 2159 Oral     SpO2 02/14/22 2159 99 %     Weight 02/14/22 2201 230 lb (104.3 kg)     Height 02/14/22 2201 5\' 9"  (1.753 m)     Head Circumference --      Peak Flow --      Pain Score 02/14/22 2201 2     Pain Loc --      Pain Edu? --      Excl. in [place]? --     Most recent vital signs: Vitals:   02/14/22 2159 02/14/22 2359  BP: (!) 153/138 (!) 145/98  Pulse: (!) 116 85  Resp: 16 20  Temp: 98.9 F (37.2 C)   SpO2: 99% 100%    General: Awake, NAD.  CV: Good peripheral perfusion.  Resp: Normal effort.  Abd: Soft, non-tender. No distention.  Neuro: At baseline. No gross neurological deficits. Other: 1.5 cm linear laceration appreciated at the palmar base of her left middle finger.  Wound edges approximate easily.  No active bleeding or discharge.  Normal sensation, cap refill, range of motion of her finger.  Normal finger cascade.  Good flexion and extension at the DIP and PIP joints  Physical Exam    ED Results / Procedures / Treatments  Labs (all labs ordered are listed, but only abnormal results are displayed) Labs  Reviewed - No data to display   EKG Not applicable   RADIOLOGY  ED Provider Interpretation: Not applicable  No results found.  PROCEDURES:  Critical Care performed: None.  Marland Kitchen.Laceration Repair  Date/Time: 02/15/2022 1:10 AM Performed by: Teodoro Spray, PA Authorized by: Teodoro Spray, PA   Consent:    Consent obtained:  Verbal   Consent given by:  Patient   Risks, benefits, and alternatives were discussed: yes     Risks discussed:  Infection and pain   Alternatives discussed:  No treatment Universal protocol:    Patient identity confirmed:  Verbally with patient Anesthesia:    Anesthesia method:  Local infiltration   Local anesthetic:  Lidocaine 1% w/o epi Laceration details:    Location:  Finger   Finger location:  L long finger   Length (cm):  1.5   Depth (mm):  2 Pre-procedure details:    Preparation:  Patient was prepped and draped in usual sterile fashion Exploration:    Hemostasis achieved with:  Direct pressure   Imaging outcome: foreign body not noted     Wound exploration: wound explored through full range of motion  and entire depth of wound visualized     Wound extent: no foreign bodies/material noted, no muscle damage noted, no nerve damage noted, no tendon damage noted, no underlying fracture noted and no vascular damage noted     Contaminated: no   Treatment:    Area cleansed with:  Soap and water and saline   Amount of cleaning:  Extensive   Irrigation solution:  Tap water   Irrigation method:  Tap   Visualized foreign bodies/material removed: no     Debridement:  None Skin repair:    Repair method:  Sutures   Suture size:  6-0   Suture material:  Prolene   Suture technique:  Simple interrupted   Number of sutures:  3 Approximation:    Approximation:  Close Repair type:    Repair type:  Simple Post-procedure details:    Dressing:  Adhesive bandage   Procedure completion:  Tolerated well, no immediate  complications    MEDICATIONS ORDERED IN ED: Medications  lidocaine (PF) (XYLOCAINE) 1 % injection 5 mL (5 mLs Infiltration Given by Other 02/14/22 2301)     IMPRESSION / MDM / [place] / ED COURSE  I reviewed the triage vital signs and the nursing notes.                              Lisa Ellison is a 31 y.o. female, history of ADD and depression, presents to the emergency department for evaluation of laceration.  Patient states that she was cutting an avocado when the knife slipped and cut her left middle finger.  Patient applied pressure to the wound with her shirt, which allowed for control of the bleeding.   Differential diagnosis includes, but is not limited to, laceration, tendon injury, fracture  ED Course Patient appears well.  Vital signs within normal limits.  She is afebrile.  Laceration anesthetized with lidocaine 1% injection.  Cleansed in the sink utilizing copious water and hand soap.  Sterilized with alcohol/iodine.  Laceration was repaired utilizing 6-0 Prolene sutures, simple interrupted x 3.  Patient tolerated the procedure well with no immediate complications.  See above for details.  Assessment/Plan History and physical exam consistent with simple laceration to the base of the middle finger on the left hand.  No evidence of flexor tendon injury or infection.  Patient maintains normal range of motion.  Bleeding was controlled.  Patient tolerated laceration repair well.  Advised patient to return the emergency department in 10 days for suture removal.  Advised her to keep the affected area clean and look out for signs of infection.  Patient was provided with anticipatory guidance, return precautions, and educational material. Encouraged the patient to return to the emergency department at any time if they begin to experience any new or worsening symptoms.       FINAL CLINICAL IMPRESSION(S) / ED DIAGNOSES   Final diagnoses:  Laceration of left  middle finger without foreign body without damage to nail, initial encounter     Rx / DC Orders   ED Discharge Orders     None        Note:  This document was prepared using Dragon voice recognition software and may include unintentional dictation errors.   Teodoro Spray, [place] 02/15/22 0113    Naaman Plummer, MD 02/15/22 [phone]

## 2022-02-26 ENCOUNTER — Ambulatory Visit: Payer: Managed Care, Other (non HMO) | Admitting: Family Medicine

## 2022-02-26 ENCOUNTER — Encounter: Payer: Self-pay | Admitting: Family Medicine

## 2022-02-26 ENCOUNTER — Other Ambulatory Visit: Payer: Self-pay

## 2022-02-26 VITALS — BP 118/88 | HR 95 | Temp 97.9°F | Resp 16 | Ht 68.0 in | Wt 245.0 lb

## 2022-02-26 DIAGNOSIS — F988 Other specified behavioral and emotional disorders with onset usually occurring in childhood and adolescence: Secondary | ICD-10-CM | POA: Diagnosis not present

## 2022-02-26 DIAGNOSIS — Z4802 Encounter for removal of sutures: Secondary | ICD-10-CM | POA: Diagnosis not present

## 2022-02-26 DIAGNOSIS — S65513D Laceration of blood vessel of left middle finger, subsequent encounter: Secondary | ICD-10-CM

## 2022-02-26 MED ORDER — AMPHETAMINE-DEXTROAMPHET ER 20 MG PO CP24: 20.0000 mg | ORAL_CAPSULE | ORAL | 0 refills | Status: DC

## 2022-02-26 MED ORDER — AMPHETAMINE-DEXTROAMPHETAMINE 20 MG PO TABS: 20.0000 mg | ORAL_TABLET | Freq: Every day | ORAL | 0 refills | Status: DC

## 2022-02-26 NOTE — Progress Notes (Addendum)
ADD Compliant with meds: She has been having trouble finding Adderall 20 mg daily ER, later Adderall later in day around 3-4 PM benefit from med (ie increase in concentration): none change in mood: none change in appetite: none Insomnia: none tremor: none compliant with behavioral modification: yes    2/16 cut  left middle finger when cutting an avocado.. Place 3 sutures.  No discharge, no fever, no redness spreading.  Needs sutures removed.  PMH and SH reviewed  ROS: Per HPI unless specifically indicated in ROS section   Meds, vitals, and allergies reviewed.   GEN: nad, alert and oriented, affect wnl and appropriate HEENT: mucous membranes moist NECK: supple w/o LA CV: rrr.  PULM: ctab, no inc wob ABD: soft, +bs EXT: no edema CN 2-12 wnl, s/s/dtr wnl x4.  No tremor.   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  Continue current medication.   Meds ordered this encounter  Medications   amphetamine-dextroamphetamine (ADDERALL XR) 20 MG 24 hr capsule    Sig: Take 1 capsule (20 mg total) by mouth every morning.    Dispense:  30 capsule    Refill:  0    DO NOT FILL UNTIL 04/27/2022   DISCONTD: amphetamine-dextroamphetamine (ADDERALL XR) 20 MG 24 hr capsule    Sig: Take 1 capsule (20 mg total) by mouth every morning.    Dispense:  30 capsule    Refill:  0   amphetamine-dextroamphetamine (ADDERALL XR) 20 MG 24 hr capsule    Sig: Take 1 capsule (20 mg total) by mouth every morning.    Dispense:  30 capsule    Refill:  0    DO NOT FILL UNTIL 03/27/2022   amphetamine-dextroamphetamine (ADDERALL) 20 MG tablet    Sig: Take 1 tablet (20 mg total) by mouth daily. Take  later in day around 3-4 PM.    Dispense:  30 tablet    Refill:  0    DO NOT FILL UNTIL 04/27/2022   amphetamine-dextroamphetamine (ADDERALL) 20 MG tablet    Sig: Take 1 tablet (20 mg total)  by mouth daily. Take  later in day around 3-4 PM.    Dispense:  30 tablet    Refill:  0   amphetamine-dextroamphetamine (ADDERALL) 20 MG tablet    Sig: Take 1 tablet (20 mg total) by mouth daily. Take  later in day around 3-4 PM.    Dispense:  30 tablet    Refill:  0    DO NOT FILL UNTIL 03/27/2022        Other Visit Diagnoses     Laceration of blood vessel of left middle finger, subsequent encounter          Patient gave consent for suture removal. Procedure note:  Well healing laceration on left middle finger.  3 sutures removed without complication.  Steri strips placed

## 2022-02-28 ENCOUNTER — Encounter: Payer: Self-pay | Admitting: Family Medicine

## 2022-02-28 MED ORDER — AMPHETAMINE-DEXTROAMPHET ER 20 MG PO CP24: 20.0000 mg | ORAL_CAPSULE | ORAL | 0 refills | Status: DC

## 2022-03-11 NOTE — Assessment & Plan Note (Signed)
Stable, chronic.  Continue current medication. ? ? ?Meds ordered this encounter  ?Medications  ?? amphetamine-dextroamphetamine (ADDERALL XR) 20 MG 24 hr capsule  ?  Sig: Take 1 capsule (20 mg total) by mouth every morning.  ?  Dispense:  30 capsule  ?  Refill:  0  ?  DO NOT FILL UNTIL 04/27/2022  ?? DISCONTD: amphetamine-dextroamphetamine (ADDERALL XR) 20 MG 24 hr capsule  ?  Sig: Take 1 capsule (20 mg total) by mouth every morning.  ?  Dispense:  30 capsule  ?  Refill:  0  ?? amphetamine-dextroamphetamine (ADDERALL XR) 20 MG 24 hr capsule  ?  Sig: Take 1 capsule (20 mg total) by mouth every morning.  ?  Dispense:  30 capsule  ?  Refill:  0  ?  DO NOT FILL UNTIL 03/27/2022  ?? amphetamine-dextroamphetamine (ADDERALL) 20 MG tablet  ?  Sig: Take 1 tablet (20 mg total) by mouth daily. Take  later in day around 3-4 PM.  ?  Dispense:  30 tablet  ?  Refill:  0  ?  DO NOT FILL UNTIL 04/27/2022  ?? amphetamine-dextroamphetamine (ADDERALL) 20 MG tablet  ?  Sig: Take 1 tablet (20 mg total) by mouth daily. Take  later in day around 3-4 PM.  ?  Dispense:  30 tablet  ?  Refill:  0  ?? amphetamine-dextroamphetamine (ADDERALL) 20 MG tablet  ?  Sig: Take 1 tablet (20 mg total) by mouth daily. Take  later in day around 3-4 PM.  ?  Dispense:  30 tablet  ?  Refill:  0  ?  DO NOT FILL UNTIL 03/27/2022  ? ? ?

## 2022-04-29 ENCOUNTER — Encounter: Payer: Self-pay | Admitting: Family Medicine

## 2022-05-28 ENCOUNTER — Ambulatory Visit: Payer: Managed Care, Other (non HMO) | Admitting: Family Medicine

## 2022-05-28 ENCOUNTER — Encounter: Payer: Self-pay | Admitting: Family Medicine

## 2022-05-28 DIAGNOSIS — F988 Other specified behavioral and emotional disorders with onset usually occurring in childhood and adolescence: Secondary | ICD-10-CM | POA: Diagnosis not present

## 2022-05-28 MED ORDER — AMPHETAMINE-DEXTROAMPHET ER 20 MG PO CP24: 20.0000 mg | ORAL_CAPSULE | ORAL | 0 refills | Status: DC

## 2022-05-28 MED ORDER — AMPHETAMINE-DEXTROAMPHETAMINE 20 MG PO TABS: 20.0000 mg | ORAL_TABLET | Freq: Every day | ORAL | 0 refills | Status: DC

## 2022-05-28 NOTE — Progress Notes (Signed)
Patient ID: Lisa Ellison, female    DOB: April 05, 1991, 31 y.o.   MRN: [id number]  This visit was conducted in person.  BP 140/90   Pulse 88   Temp 98.1 F (36.7 C) (Oral)   Ht '5\' 8"'$  (1.727 m)   Wt 234 lb 4 oz (106.3 kg)   LMP 05/16/2022   SpO2 98%   Breastfeeding No   BMI 35.62 kg/m    CC:  Chief Complaint  Patient presents with   Follow-up    ADD    Subjective:   HPI: Lisa Ellison is a 31 y.o. female presenting on 05/28/2022 for Follow-up (ADD)  ADD Compliant with meds: Adderall ER 20 mg daily. Additional Adderall short acting later in day. benefit from med (ie increase in concentration): good change in mood: She is working through stress well, using deep breaths, trying to be more positive. change in appetite: no... she is eating healthier.Marland Kitchen less cheeseburger. Insomnia:  none tremor: none compliant with behavioral modification: yes    She has been under stress.. issue with car.  BP Readings from Last 3 Encounters:  05/28/22 140/90  02/26/22 118/88  02/14/22 (!) 145/98    Wt Readings from Last 3 Encounters:  05/28/22 234 lb 4 oz (106.3 kg)  02/26/22 245 lb (111.1 kg)  02/14/22 230 lb (104.3 kg)        Relevant past medical, surgical, family and social history reviewed and updated as indicated. Interim medical history since our last visit reviewed. Allergies and medications reviewed and updated. Outpatient Medications Prior to Visit  Medication Sig Dispense Refill   amphetamine-dextroamphetamine (ADDERALL XR) 20 MG 24 hr capsule Take 1 capsule (20 mg total) by mouth every morning. 30 capsule 0   amphetamine-dextroamphetamine (ADDERALL XR) 20 MG 24 hr capsule Take 1 capsule (20 mg total) by mouth every morning. 30 capsule 0   amphetamine-dextroamphetamine (ADDERALL XR) 20 MG 24 hr capsule Take 1 capsule (20 mg total) by mouth every morning. 30 capsule 0   amphetamine-dextroamphetamine (ADDERALL) 20 MG tablet Take 1 tablet (20 mg total) by  mouth daily. Take  later in day around 3-4 PM. 30 tablet 0   amphetamine-dextroamphetamine (ADDERALL) 20 MG tablet Take 1 tablet (20 mg total) by mouth daily. Take  later in day around 3-4 PM. 30 tablet 0   amphetamine-dextroamphetamine (ADDERALL) 20 MG tablet Take 1 tablet (20 mg total) by mouth daily. Take  later in day around 3-4 PM. 30 tablet 0   ferrous sulfate 325 (65 FE) MG EC tablet Take 1 tablet (325 mg total) by mouth daily with breakfast. (Patient not taking: Reported on 02/26/2022) 90 tablet 0   Prenatal Vit-Fe Fumarate-FA (PRENATAL MULTIVITAMIN) TABS tablet Take 1 tablet by mouth daily at 12 noon. (Patient not taking: Reported on 02/26/2022)     No facility-administered medications prior to visit.     Per HPI unless specifically indicated in ROS section below Review of Systems  Constitutional:  Negative for fatigue and fever.  HENT:  Negative for ear pain.   Eyes:  Negative for pain.  Respiratory:  Negative for chest tightness and shortness of breath.   Cardiovascular:  Negative for chest pain, palpitations and leg swelling.  Gastrointestinal:  Negative for abdominal pain.  Genitourinary:  Negative for dysuria.  Objective:  BP 140/90   Pulse 88   Temp 98.1 F (36.7 C) (Oral)   Ht '5\' 8"'$  (1.727 m)   Wt 234 lb 4 oz (106.3 kg)  LMP 05/16/2022   SpO2 98%   Breastfeeding No   BMI 35.62 kg/m   Wt Readings from Last 3 Encounters:  05/28/22 234 lb 4 oz (106.3 kg)  02/26/22 245 lb (111.1 kg)  02/14/22 230 lb (104.3 kg)      Physical Exam Constitutional:      General: She is not in acute distress.    Appearance: Normal appearance. She is well-developed. She is not ill-appearing or toxic-appearing.  HENT:     Head: Normocephalic.     Right Ear: Hearing, tympanic membrane, ear canal and external ear normal. Tympanic membrane is not erythematous, retracted or bulging.     Left Ear: Hearing, tympanic membrane, ear canal and external ear normal. Tympanic membrane is not  erythematous, retracted or bulging.     Nose: No mucosal edema or rhinorrhea.     Right Sinus: No maxillary sinus tenderness or frontal sinus tenderness.     Left Sinus: No maxillary sinus tenderness or frontal sinus tenderness.     Mouth/Throat:     Pharynx: Uvula midline.  Eyes:     General: Lids are normal. Lids are everted, no foreign bodies appreciated.     Conjunctiva/sclera: Conjunctivae normal.     Pupils: Pupils are equal, round, and reactive to light.  Neck:     Thyroid: No thyroid mass or thyromegaly.     Vascular: No carotid bruit.     Trachea: Trachea normal.  Cardiovascular:     Rate and Rhythm: Normal rate and regular rhythm.     Pulses: Normal pulses.     Heart sounds: Normal heart sounds, S1 normal and S2 normal. No murmur heard.   No friction rub. No gallop.  Pulmonary:     Effort: Pulmonary effort is normal. No tachypnea or respiratory distress.     Breath sounds: Normal breath sounds. No decreased breath sounds, wheezing, rhonchi or rales.  Abdominal:     General: Bowel sounds are normal.     Palpations: Abdomen is soft.     Tenderness: There is no abdominal tenderness.  Musculoskeletal:     Cervical back: Normal range of motion and neck supple.  Skin:    General: Skin is warm and dry.     Findings: No rash.  Neurological:     Mental Status: She is alert.  Psychiatric:        Mood and Affect: Mood is not anxious or depressed.        Speech: Speech normal.        Behavior: Behavior normal. Behavior is cooperative.        Thought Content: Thought content normal.        Judgment: Judgment normal.      Results for orders placed or performed in visit on 07/11/21  HM PAP SMEAR  Result Value Ref Range   HM Pap smear ASC-US/HPV Negative      COVID 19 screen:  No recent travel or known exposure to [place] The patient denies respiratory symptoms of COVID 19 at this time. The importance of social distancing was discussed today.   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 daily with additional Adderall 20 mg short acting dose later in the day.  Medications refilled.  Of note in office today blood pressure is slightly elevated.  She states this is due to  recent stress.  I have encouraged her to follow it closely with a blood pressure cuff at home.  She will call if it is persistently greater than 140/90.        Meds ordered this encounter  Medications   amphetamine-dextroamphetamine (ADDERALL XR) 20 MG 24 hr capsule    Sig: Take 1 capsule (20 mg total) by mouth every morning.    Dispense:  30 capsule    Refill:  0    DO NOT FILL UNTIL 07/28/2022   amphetamine-dextroamphetamine (ADDERALL XR) 20 MG 24 hr capsule    Sig: Take 1 capsule (20 mg total) by mouth every morning.    Dispense:  30 capsule    Refill:  0    DO NOT FILL UNTIL 06/28/2022   amphetamine-dextroamphetamine (ADDERALL XR) 20 MG 24 hr capsule    Sig: Take 1 capsule (20 mg total) by mouth every morning.    Dispense:  30 capsule    Refill:  0   amphetamine-dextroamphetamine (ADDERALL) 20 MG tablet    Sig: Take 1 tablet (20 mg total) by mouth daily. Take  later in day around 3-4 PM.    Dispense:  30 tablet    Refill:  0    DO NOT FILL UNTIL 07/28/2022   amphetamine-dextroamphetamine (ADDERALL) 20 MG tablet    Sig: Take 1 tablet (20 mg total) by mouth daily. Take  later in day around 3-4 PM.    Dispense:  30 tablet    Refill:  0    DO NOT FILL UNTIL 06/28/2022   amphetamine-dextroamphetamine (ADDERALL) 20 MG tablet    Sig: Take 1 tablet (20 mg total) by mouth daily. Take  later in day around 3-4 PM.    Dispense:  30 tablet    Refill:  0     Eliezer Lofts, MD

## 2022-05-28 NOTE — Assessment & Plan Note (Signed)
Chronic, well controlled on Adderall XL 20 mg p.o. daily with additional Adderall 20 mg short acting dose later in the day.  Medications refilled.  Of note in office today blood pressure is slightly elevated.  She states this is due to recent stress.  I have encouraged her to follow it closely with a blood pressure cuff at home.  She will call if it is persistently greater than 140/90.

## 2022-06-03 ENCOUNTER — Telehealth: Payer: Self-pay

## 2022-06-03 NOTE — Telephone Encounter (Signed)
Prior auth started for Adderall XR '20MG'$  er capsules. Myrle Sheng Key: [id number] - PA Case ID: [id number] - Rx #: [id number] Waiting for determination.

## 2022-06-14 ENCOUNTER — Other Ambulatory Visit: Payer: Self-pay | Admitting: Family Medicine

## 2022-06-14 MED ORDER — LISDEXAMFETAMINE DIMESYLATE 20 MG PO CAPS: 20.0000 mg | ORAL_CAPSULE | Freq: Every day | ORAL | 0 refills | Status: DC

## 2022-06-14 NOTE — Telephone Encounter (Signed)
Evaline notified as instructed by telephone.  Patient states understanding.

## 2022-06-14 NOTE — Telephone Encounter (Signed)
Spoke with Pharmacist at [facility].  They currently do have the regular Adderall 20 mg in stock just not the XR.  I did speak with Lisa Ellison and she would be willing to try the Vyvanse.  Please advise.  Patient uses [facility] [address] in [place].

## 2022-06-14 NOTE — Telephone Encounter (Signed)
Called and spoke with Pharmacist at [facility] to see why we were getting PA request for Brand Name Adderall when generic Rx was sent in.  Insurance will not pay for Brand Name.  Pharmacy stated that the generic Adderall is on a Producer, television/film/video and they just haven't been able to get it.  She states some providers are changing their patient's to Vyvanse or Focalin which are available.  FYI to Dr. Diona Browner.

## 2022-06-14 NOTE — Telephone Encounter (Signed)
Can you contact the pharmacist at [facility] to determine if it is just Adderall ER ( not plain adderall) that is on national shortage given this patient is on both Adderall XR and fast acting Adderall.  I feel like it is both  Also call patient and let me know if she is agreeable to the change suggested below:   I will likely change to Vyvanse.. If short acting Adderall is not avai Ii AM ( 30 mg ) given it has a longer duration of activity.

## 2022-06-14 NOTE — Telephone Encounter (Signed)
Prior auth for Adderall XR 20MG er capsules has been denied.   Myrle Sheng Key: [id number] - PA Case ID: [id number] - Rx #: [id number] Please follow up with this PA as needed.  Thank you  The prior auth never asked if patient could take the generic drug or not.  It only asked if patient had a adverse reaction to the generic drug or if the generic drug didn't work well enough.  Response from [employer]:    Based on the information provided, I am unable to approve coverage for this medication because: Multi-Source Brand (MSB) Name drugs are considered medically necessary when there is  documentation of both of the following: 1. The individual has tried a bioequivalent generic form of  the drug; and 2. The individual cannot take the bioequivalent generic form of the drug due to an  allergy or bad reaction to one or more inactive ingredients (dyes, fillers, preservatives) which are  not present in the brand form of the drug. There is nothing to support that this information was  received. Adderall XR is medically necessary when the individual meets both of the following (1 and 2): 1.  Documented diagnosis of one of the following (A, B, C, D, or E): A. Attention Deficit Hyperactivity  Disorder (ADD/ADHD); B. Narcolepsy; C. Adjunctive/augmentation treatment for depression and  meets both of the following (i and ii): i. Individual is 70 years of age or older; and ii. Individual is  concurrently receiving other medication therapy for depression (for example, selective serotonin  reuptake inhibitors [SSRIs]); D. Fatigue associated with cancer and/or its treatment; or E. Idiopathic  Hypersomnolence and meets the following: i. Diagnosis is confirmed by a sleep specialist  physician or at an institution (sleep center) that specializes in sleep disorders; and 2. There is  documentation of both of the following (A and B): A. The individual has tried  amphetamine/dextroamphetamine salts extended-release capsules (the  bioequivalent generic  product) and cannot take due to a formulation difference in the inactive ingredient(s) which would  result in a significant allergy or serious adverse reaction; and B. The individual has had an  inadequate response, contraindication, or is intolerant to both of the following: i.  dexmethylphenidate ER (generic for Focalin XR); and ii. methylphenidate ER capsules (generic for  Ritalin LA or generic for Aptensio XR) or methylphenidate ER tablet (generic for Concerta).  Attention Deficit Hyperactivity Disorder (ADHD) stimulants are considered medically necessary for  continued use when initial criteria are met and there is documentation of beneficial response. Any  other use is considered experimental, investigational or unproven, including the following (this list  may not be all inclusive): 1. Fatigue associated with Multiple Sclerosis (MS); 2. Long-term  combination therapy (that is, greater than 2 months) with Strattera and Central Nervous System  (CNS) Stimulants for the treatment of ADD/ADHD (for example, mixed amphetamine salts  extended-release capsules [Adderall XR, generics], methylphenidate extended-release tablets,  methylphenidate immediate-release tablets); 3. Neuroenhancement; and/or 4. Weight Loss.

## 2022-06-14 NOTE — Telephone Encounter (Signed)
Sent in prescription for Vyvanse x 1 month.  For started 20 mg daily since she will continue using the Adderall short acting later in the day.  She can make an appointment for follow-up work and call/mychart update in 3 weeks.  At that time I we will send in 2 additional prescriptions for Vyvanse if she is tolerating this dose.  Make sure patient aware of the plan.

## 2022-06-19 ENCOUNTER — Telehealth: Payer: Self-pay | Admitting: *Deleted

## 2022-06-19 NOTE — Telephone Encounter (Signed)
Received fax from [facility] requesting PA for Vyvanse 20 mg.  PA completed on CoverMyMeds and sent for review.  Can take up to 72 hours for a decision.

## 2022-06-25 MED ORDER — DEXMETHYLPHENIDATE HCL ER 20 MG PO CP24: 20.0000 mg | ORAL_CAPSULE | Freq: Every day | ORAL | 0 refills | Status: DC

## 2022-06-25 NOTE — Telephone Encounter (Signed)
Medication changed.  Focalin xr 20 mg p.o. in the a.m. sent in for patient.  She can take an additional fast acting Adderall 20 mg later in the day if needed

## 2022-06-26 NOTE — Telephone Encounter (Signed)
Left message for Lisa Ellison with below information from Dr. Diona Browner  I ask that she call me back if she has any questions.

## 2022-07-03 ENCOUNTER — Encounter: Payer: Self-pay | Admitting: Family Medicine

## 2022-07-03 NOTE — Telephone Encounter (Signed)
I have completed another PA for Vyvanse 20 mg on CoverMyMeds given Focalin was ineffective.  PA sent for review and can take up to 72 hours for a decision.

## 2022-07-05 NOTE — Telephone Encounter (Signed)
PA denied again for Vyvanse.  I would recommend appealing this decision a this point for reasons of generic Adderall XR on national short supply and hard to get, her insurance denied PA for Brand Name Adderall XR.  Patient tried Focalin without good results.    Will forward to Dr. Diona Browner to write appeal letter.

## 2022-07-09 NOTE — Telephone Encounter (Signed)
Appeal letter printed and signed.. please fax to appeals department and let patient know.

## 2022-07-10 NOTE — Telephone Encounter (Signed)
Appeal letter faxed to [employer] at [phone].

## 2022-08-21 ENCOUNTER — Telehealth: Payer: Self-pay | Admitting: Family Medicine

## 2022-08-21 DIAGNOSIS — Z13220 Encounter for screening for lipoid disorders: Secondary | ICD-10-CM

## 2022-08-21 DIAGNOSIS — Z1159 Encounter for screening for other viral diseases: Secondary | ICD-10-CM

## 2022-08-21 NOTE — Telephone Encounter (Signed)
-----   Message from Ellamae Sia sent at 08/12/2022  2:36 PM EDT ----- Regarding: Lab orders for Thursday, 8.24.23 Patient is scheduled for CPX labs, please order future labs, Thanks , Karna Christmas

## 2022-08-22 ENCOUNTER — Other Ambulatory Visit: Payer: Managed Care, Other (non HMO)

## 2022-08-26 ENCOUNTER — Other Ambulatory Visit (INDEPENDENT_AMBULATORY_CARE_PROVIDER_SITE_OTHER): Payer: Managed Care, Other (non HMO)

## 2022-08-26 DIAGNOSIS — Z13220 Encounter for screening for lipoid disorders: Secondary | ICD-10-CM | POA: Diagnosis not present

## 2022-08-26 DIAGNOSIS — Z1159 Encounter for screening for other viral diseases: Secondary | ICD-10-CM

## 2022-08-26 LAB — COMPREHENSIVE METABOLIC PANEL
ALT: 27 U/L (ref 0–35)
AST: 24 U/L (ref 0–37)
Albumin: 4.5 g/dL (ref 3.5–5.2)
Alkaline Phosphatase: 62 U/L (ref 39–117)
BUN: 11 mg/dL (ref 6–23)
CO2: 27 mEq/L (ref 19–32)
Calcium: 9.1 mg/dL (ref 8.4–10.5)
Chloride: 100 mEq/L (ref 96–112)
Creatinine, Ser: 0.88 mg/dL (ref 0.40–1.20)
GFR: 88.72 mL/min (ref >60.00)
Glucose, Bld: 86 mg/dL (ref 70–99)
Potassium: 3.9 mEq/L (ref 3.5–5.1)
Sodium: 140 mEq/L (ref 135–145)
Total Bilirubin: 0.4 mg/dL (ref 0.2–1.2)
Total Protein: 7.1 g/dL (ref 6.0–8.3)

## 2022-08-26 LAB — LIPID PANEL
Cholesterol: 205 mg/dL — ABNORMAL HIGH (ref 0–200)
HDL: 55.7 mg/dL (ref >39.00)
LDL Cholesterol: 114 mg/dL — ABNORMAL HIGH (ref 0–99)
NonHDL: 149.47
Total CHOL/HDL Ratio: 4
Triglycerides: 179 mg/dL — ABNORMAL HIGH (ref 0.0–149.0)
VLDL: 35.8 mg/dL (ref 0.0–40.0)

## 2022-08-26 NOTE — Progress Notes (Signed)
No critical labs need to be addressed urgently. We will discuss labs in detail at upcoming office visit.   

## 2022-08-27 LAB — HEPATITIS C ANTIBODY: Hepatitis C Ab: NONREACTIVE

## 2022-08-29 ENCOUNTER — Ambulatory Visit: Payer: Managed Care, Other (non HMO) | Admitting: Family Medicine

## 2022-08-29 ENCOUNTER — Encounter: Payer: Self-pay | Admitting: Family Medicine

## 2022-08-29 VITALS — BP 128/100 | HR 86 | Temp 98.2°F | Ht 68.25 in | Wt 226.4 lb

## 2022-08-29 DIAGNOSIS — F325 Major depressive disorder, single episode, in full remission: Secondary | ICD-10-CM

## 2022-08-29 DIAGNOSIS — R030 Elevated blood-pressure reading, without diagnosis of hypertension: Secondary | ICD-10-CM | POA: Diagnosis not present

## 2022-08-29 DIAGNOSIS — Z23 Encounter for immunization: Secondary | ICD-10-CM | POA: Diagnosis not present

## 2022-08-29 DIAGNOSIS — Z0000 Encounter for general adult medical examination without abnormal findings: Secondary | ICD-10-CM

## 2022-08-29 DIAGNOSIS — F988 Other specified behavioral and emotional disorders with onset usually occurring in childhood and adolescence: Secondary | ICD-10-CM | POA: Diagnosis not present

## 2022-08-29 DIAGNOSIS — E041 Nontoxic single thyroid nodule: Secondary | ICD-10-CM

## 2022-08-29 NOTE — Assessment & Plan Note (Addendum)
Well controlled on Adderall 20 mg XR with additional 20 mg short acting later in the day. With recent blood pressure elevations later in the day as well as headache, I will have her hold the additional second 20 mg dose.  She will follow-up in 1 week with blood pressure home measurements.  We will recheck her blood pressure at that time.  NO SE.  PDMP reviewed, no red flags.

## 2022-08-29 NOTE — Patient Instructions (Addendum)
Goal BP < 140/90.Marland Kitchen check blood pressure daily in morning and when feeling bad as well.. record.  Continue Adderall XR and hold later in day  short acting Adderall.  Decrease salt , alcohol and caffeine.  Please stop at the lab to have labs drawn.

## 2022-08-29 NOTE — Progress Notes (Signed)
Patient ID: Lisa Ellison, female    DOB: Nov 28, 1991, 31 y.o.   MRN: [id number]  This visit was conducted in person.  Pulse 86   Temp 98.2 F (36.8 C) (Oral)   Ht 5' 8.25" (1.734 m)   Wt 226 lb 6 oz (102.7 kg)   LMP 08/14/2022   SpO2 98%   BMI 34.17 kg/m    BP Readings from Last 3 Encounters:  08/29/22 (!) 128/100  05/28/22 140/90  02/26/22 118/88    CC:  Chief Complaint  Patient presents with   Annual Exam    Subjective:   HPI: Lisa Ellison is a 31 y.o. female presenting on 08/29/2022 for Annual Exam  Blood pressure ins elevated today in the office.  Has headache later in the day after work. Describes as a tightness in her head.  Turns into a migraine x 3 times in last month.  She has noted some spurts of increase anxiety.   She has been losing weight. Wt Readings from Last 3 Encounters:  08/29/22 226 lb 6 oz (102.7 kg)  05/28/22 234 lb 4 oz (106.3 kg)  02/26/22 245 lb (111.1 kg)    MDD, in remission [facility] Visit from 02/26/2022 in [place] at [facility] Total Score 0         ADD  well controlled on Adderall 20 mg XR with additional 20 mg short acting later in the day.  NO SE.  PDMP reviewed, no red flags.    Reviewed labs in detail.   She has noted thyroid nodule may be slightly elevated.. evaluated by ENDO in past.  Relevant past medical, surgical, family and social history reviewed and updated as indicated. Interim medical history since our last visit reviewed. Allergies and medications reviewed and updated. Outpatient Medications Prior to Visit  Medication Sig Dispense Refill   amphetamine-dextroamphetamine (ADDERALL XR) 20 MG 24 hr capsule Take 20 mg by mouth daily.     amphetamine-dextroamphetamine (ADDERALL XR) 20 MG 24 hr capsule Take 20 mg by mouth daily.     amphetamine-dextroamphetamine (ADDERALL XR) 20 MG 24 hr capsule Take 20 mg by mouth daily.     amphetamine-dextroamphetamine  (ADDERALL) 20 MG tablet Take 1 tablet (20 mg total) by mouth daily. Take  later in day around 3-4 PM. 30 tablet 0   amphetamine-dextroamphetamine (ADDERALL) 20 MG tablet Take 1 tablet (20 mg total) by mouth daily. Take  later in day around 3-4 PM. 30 tablet 0   amphetamine-dextroamphetamine (ADDERALL) 20 MG tablet Take 1 tablet (20 mg total) by mouth daily. Take  later in day around 3-4 PM. 30 tablet 0   dexmethylphenidate (FOCALIN XR) 20 MG 24 hr capsule Take 1 capsule (20 mg total) by mouth daily. 30 capsule 0   No facility-administered medications prior to visit.     Per HPI unless specifically indicated in ROS section below Review of Systems  Constitutional:  Negative for fatigue and fever.  HENT:  Negative for congestion.   Eyes:  Negative for pain.  Respiratory:  Negative for cough and shortness of breath.   Cardiovascular:  Negative for chest pain, palpitations and leg swelling.  Gastrointestinal:  Negative for abdominal pain.  Genitourinary:  Negative for dysuria and vaginal bleeding.  Musculoskeletal:  Negative for back pain.  Neurological:  Negative for syncope, light-headedness and headaches.  Psychiatric/Behavioral:  Negative for dysphoric mood.    Objective:  Pulse 86   Temp 98.2 F (36.8 C) (Oral)  Ht 5' 8.25" (1.734 m)   Wt 226 lb 6 oz (102.7 kg)   LMP 08/14/2022   SpO2 98%   BMI 34.17 kg/m   Wt Readings from Last 3 Encounters:  08/29/22 226 lb 6 oz (102.7 kg)  05/28/22 234 lb 4 oz (106.3 kg)  02/26/22 245 lb (111.1 kg)      Physical Exam Constitutional:      General: She is not in acute distress.    Appearance: Normal appearance. She is well-developed. She is not ill-appearing or toxic-appearing.  HENT:     Head: Normocephalic.     Right Ear: Hearing, tympanic membrane, ear canal and external ear normal. Tympanic membrane is not erythematous, retracted or bulging.     Left Ear: Hearing, tympanic membrane, ear canal and external ear normal. Tympanic  membrane is not erythematous, retracted or bulging.     Nose: No mucosal edema or rhinorrhea.     Right Sinus: No maxillary sinus tenderness or frontal sinus tenderness.     Left Sinus: No maxillary sinus tenderness or frontal sinus tenderness.     Mouth/Throat:     Pharynx: Uvula midline.  Eyes:     General: Lids are normal. Lids are everted, no foreign bodies appreciated.     Conjunctiva/sclera: Conjunctivae normal.     Pupils: Pupils are equal, round, and reactive to light.  Neck:     Thyroid: No thyroid mass or thyromegaly.     Vascular: No carotid bruit.     Trachea: Trachea normal.  Cardiovascular:     Rate and Rhythm: Normal rate and regular rhythm.     Pulses: Normal pulses.     Heart sounds: Normal heart sounds, S1 normal and S2 normal. No murmur heard.    No friction rub. No gallop.  Pulmonary:     Effort: Pulmonary effort is normal. No tachypnea or respiratory distress.     Breath sounds: Normal breath sounds. No decreased breath sounds, wheezing, rhonchi or rales.  Abdominal:     General: Bowel sounds are normal.     Palpations: Abdomen is soft.     Tenderness: There is no abdominal tenderness.  Musculoskeletal:     Cervical back: Normal range of motion and neck supple.  Skin:    General: Skin is warm and dry.     Findings: No rash.  Neurological:     Mental Status: She is alert.  Psychiatric:        Mood and Affect: Mood is not anxious or depressed.        Speech: Speech normal.        Behavior: Behavior normal. Behavior is cooperative.        Thought Content: Thought content normal.        Judgment: Judgment normal.       Results for orders placed or performed in visit on 08/26/22  Hepatitis C antibody  Result Value Ref Range   Hepatitis C Ab NON-REACTIVE NON-REACTIVE  Lipid panel  Result Value Ref Range   Cholesterol 205 (H) 0 - 200 mg/dL   Triglycerides 179.0 (H) 0.0 - 149.0 mg/dL   HDL 55.70 >39.00 mg/dL   VLDL 35.8 0.0 - 40.0 mg/dL   LDL  Cholesterol 114 (H) 0 - 99 mg/dL   Total CHOL/HDL Ratio 4    NonHDL 149.47   Comprehensive metabolic panel  Result Value Ref Range   Sodium 140 135 - 145 mEq/L   Potassium 3.9 3.5 - 5.1 mEq/L   Chloride 100  96 - 112 mEq/L   CO2 27 19 - 32 mEq/L   Glucose, Bld 86 70 - 99 mg/dL   BUN 11 6 - 23 mg/dL   Creatinine, Ser 0.88 0.40 - 1.20 mg/dL   Total Bilirubin 0.4 0.2 - 1.2 mg/dL   Alkaline Phosphatase 62 39 - 117 U/L   AST 24 0 - 37 U/L   ALT 27 0 - 35 U/L   Total Protein 7.1 6.0 - 8.3 g/dL   Albumin 4.5 3.5 - 5.2 g/dL   GFR 88.72 >60.00 mL/min   Calcium 9.1 8.4 - 10.5 mg/dL     COVID 19 screen:  No recent travel or known exposure to COVID19 The patient denies respiratory symptoms of COVID 19 at this time. The importance of social distancing was discussed today.   Assessment and Plan   The patient's preventative maintenance and recommended screening tests for an annual wellness exam were reviewed in full today. Brought up to date unless services declined.  Counselled on the importance of diet, exercise, and its role in overall health and mortality. The patient's FH and SH was reviewed, including their home life, tobacco status, and drug and alcohol status.   Vaccines: Due for flu shot  ( given today)and second HPV vaccine.  Tetanus up-to-date. 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 With recent blood pressure elevations later in the day as well as headache, I will have her hold the additional second 20 mg dose.  She will  follow-up in 1 week with blood pressure home measurements.  We will recheck her blood pressure at that time.  NO SE.  PDMP reviewed, no red flags.      Elevated blood-pressure reading without diagnosis of hypertension    Acute  Possibly due to stimulant medication.  We will hold afternoon dose of Adderall.  She will work on decreasing alcohol, caffeine and salt intake.  She has continued to lose weight with about 20 pound weight loss in the last 6 months.   Follow up in 2 weeks.      Relevant Orders   CBC with Differential/Platelet   Major depression in complete remission ([place])    Well-controlled in remission      Thyroid nodule    Increasing nodule on left thyroid, no redness, no pain or heat.  I will check thyroid levels.  She will follow-up with endocrinology for possible reevaluation.      Relevant Orders   TSH   T4, free   T3, free   Other Visit Diagnoses     Routine general medical examination at a health care facility    -  Primary   Relevant Orders   Flu Vaccine QUAD 6+ mos PF IM (Fluarix Quad PF) (Completed)   Need for influenza vaccination       Relevant Orders   Flu Vaccine QUAD 6+ mos PF IM (Fluarix Quad PF) (Completed)       Eliezer Lofts, MD

## 2022-08-29 NOTE — Assessment & Plan Note (Signed)
Well-controlled in remission

## 2022-08-30 DIAGNOSIS — E041 Nontoxic single thyroid nodule: Secondary | ICD-10-CM | POA: Insufficient documentation

## 2022-08-30 NOTE — Assessment & Plan Note (Signed)
Increasing nodule on left thyroid, no redness, no pain or heat.  I will check thyroid levels.  She will follow-up with endocrinology for possible reevaluation.

## 2022-08-30 NOTE — Assessment & Plan Note (Signed)
Acute  Possibly due to stimulant medication.  We will hold afternoon dose of Adderall.  She will work on decreasing alcohol, caffeine and salt intake.  She has continued to lose weight with about 20 pound weight loss in the last 6 months.   Follow up in 2 weeks.

## 2022-09-30 ENCOUNTER — Encounter: Payer: Self-pay | Admitting: Family Medicine

## 2022-10-01 NOTE — Telephone Encounter (Signed)
Last office 08/29/2022 for CPE.  Last refilled 05/28/22 for #30 with no refills.  No future appointments.

## 2022-10-04 MED ORDER — AMPHETAMINE-DEXTROAMPHETAMINE 20 MG PO TABS: 20.0000 mg | ORAL_TABLET | Freq: Every day | ORAL | 0 refills | Status: AC

## 2022-10-04 MED ORDER — AMPHETAMINE-DEXTROAMPHET ER 20 MG PO CP24: 20.0000 mg | ORAL_CAPSULE | Freq: Every day | ORAL | 0 refills | Status: AC
